# Patient Record
Sex: Male | Born: 1990 | Race: White | Hispanic: No | Marital: Single | Smoking: Current every day smoker
Health system: Southern US, Community
[De-identification: ages and names within clinical notes are randomized; demographics above are authoritative.]

## PROBLEM LIST (undated history)

## (undated) DIAGNOSIS — F419 Anxiety disorder, unspecified: Secondary | ICD-10-CM

## (undated) DIAGNOSIS — F845 Asperger's syndrome: Secondary | ICD-10-CM

## (undated) DIAGNOSIS — G4700 Insomnia, unspecified: Secondary | ICD-10-CM

---
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
## 2012-10-06 ENCOUNTER — Emergency Department (HOSPITAL_COMMUNITY): Admission: EM | Admit: 2012-10-06 | Payer: Self-pay | Source: Home / Self Care

## 2013-02-19 ENCOUNTER — Emergency Department (HOSPITAL_COMMUNITY)
Admission: EM | Admit: 2013-02-19 | Discharge: 2013-02-19 | Discharge status: Against Medical Advice | Payer: No Typology Code available for payment source | Attending: Emergency Medicine | Admitting: Emergency Medicine

## 2013-02-19 DIAGNOSIS — Y9389 Activity, other specified: Secondary | ICD-10-CM | POA: Insufficient documentation

## 2013-02-19 DIAGNOSIS — Y92410 Unspecified street and highway as the place of occurrence of the external cause: Secondary | ICD-10-CM | POA: Insufficient documentation

## 2013-02-19 DIAGNOSIS — S4980XA Other specified injuries of shoulder and upper arm, unspecified arm, initial encounter: Secondary | ICD-10-CM | POA: Insufficient documentation

## 2013-02-19 DIAGNOSIS — S46909A Unspecified injury of unspecified muscle, fascia and tendon at shoulder and upper arm level, unspecified arm, initial encounter: Secondary | ICD-10-CM | POA: Insufficient documentation

## 2013-02-19 NOTE — ED Notes (Signed)
Pt to waiting area nurse first saying he wasn't going to wait any longer; ambulated without difficulty; walked out with mother
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
## 2013-02-19 NOTE — ED Notes (Signed)
Pt walking around in waiting area without difficulty; mother to triage desk multiple times asking how much longer; informed of wait

## 2013-03-28 ENCOUNTER — Encounter (HOSPITAL_COMMUNITY): Payer: Self-pay | Admitting: Emergency Medicine

## 2013-03-28 ENCOUNTER — Emergency Department (HOSPITAL_COMMUNITY)
Admission: EM | Admit: 2013-03-28 | Discharge: 2013-03-28 | Disposition: A | Discharge status: Home / Self Care | Payer: No Typology Code available for payment source | Attending: Emergency Medicine | Admitting: Emergency Medicine

## 2013-03-28 ENCOUNTER — Emergency Department (HOSPITAL_COMMUNITY): Payer: No Typology Code available for payment source

## 2013-03-28 DIAGNOSIS — G479 Sleep disorder, unspecified: Secondary | ICD-10-CM | POA: Insufficient documentation

## 2013-03-28 DIAGNOSIS — G43909 Migraine, unspecified, not intractable, without status migrainosus: Secondary | ICD-10-CM | POA: Insufficient documentation

## 2013-03-28 DIAGNOSIS — F17200 Nicotine dependence, unspecified, uncomplicated: Secondary | ICD-10-CM | POA: Insufficient documentation

## 2013-03-28 DIAGNOSIS — S060XAA Concussion with loss of consciousness status unknown, initial encounter: Secondary | ICD-10-CM

## 2013-03-28 DIAGNOSIS — S060X9A Concussion with loss of consciousness of unspecified duration, initial encounter: Secondary | ICD-10-CM

## 2013-03-28 DIAGNOSIS — H5500 Unspecified nystagmus: Secondary | ICD-10-CM | POA: Insufficient documentation

## 2013-03-28 DIAGNOSIS — W1809XA Striking against other object with subsequent fall, initial encounter: Secondary | ICD-10-CM | POA: Insufficient documentation

## 2013-03-28 DIAGNOSIS — Y92410 Unspecified street and highway as the place of occurrence of the external cause: Secondary | ICD-10-CM | POA: Insufficient documentation

## 2013-03-28 DIAGNOSIS — F411 Generalized anxiety disorder: Secondary | ICD-10-CM | POA: Insufficient documentation

## 2013-03-28 DIAGNOSIS — S060X0A Concussion without loss of consciousness, initial encounter: Secondary | ICD-10-CM | POA: Insufficient documentation

## 2013-03-28 DIAGNOSIS — Y9351 Activity, roller skating (inline) and skateboarding: Secondary | ICD-10-CM | POA: Insufficient documentation

## 2013-03-28 MED ORDER — IBUPROFEN 800 MG PO TABS: 800.0000 mg | ORAL_TABLET | Freq: Three times a day (TID) | ORAL | Status: DC
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
## 2013-03-28 NOTE — Discharge Instructions (Signed)
Call for a follow up appointment with a Family or Primary Care Provider.  Call for an appointment with Dr. Amada JupiterKirkpatrick. Return if Symptoms worsen.   Take medication as prescribed.  Continue cognitive rest (sitting in a dark quite room).  Limit screen time. You can take Ibuprofen for headaches.
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
## 2013-03-28 NOTE — ED Notes (Signed)
Pt returned from CT °

## 2013-04-03 NOTE — ED Provider Notes (Signed)
Medical screening examination/treatment/procedure(s) were performed by non-physician practitioner and as supervising physician I was immediately available for consultation/collaboration.   EKG Interpretation None        Estefano Victory J. Kion Huntsberry, MD 04/03/13 2117 

## 2013-05-09 ENCOUNTER — Emergency Department (HOSPITAL_COMMUNITY)
Admission: EM | Admit: 2013-05-09 | Discharge: 2013-05-09 | Disposition: A | Discharge status: Home / Self Care | Payer: No Typology Code available for payment source | Attending: Emergency Medicine | Admitting: Emergency Medicine

## 2013-05-09 ENCOUNTER — Encounter (HOSPITAL_COMMUNITY): Payer: Self-pay | Admitting: Emergency Medicine

## 2013-05-09 DIAGNOSIS — T22019A Burn of unspecified degree of unspecified forearm, initial encounter: Secondary | ICD-10-CM | POA: Insufficient documentation

## 2013-05-09 DIAGNOSIS — Y929 Unspecified place or not applicable: Secondary | ICD-10-CM | POA: Insufficient documentation

## 2013-05-09 DIAGNOSIS — G478 Other sleep disorders: Secondary | ICD-10-CM | POA: Insufficient documentation

## 2013-05-09 DIAGNOSIS — Y939 Activity, unspecified: Secondary | ICD-10-CM | POA: Insufficient documentation

## 2013-05-09 DIAGNOSIS — T22012A Burn of unspecified degree of left forearm, initial encounter: Secondary | ICD-10-CM

## 2013-05-09 DIAGNOSIS — Z046 Encounter for general psychiatric examination, requested by authority: Secondary | ICD-10-CM | POA: Insufficient documentation

## 2013-05-09 DIAGNOSIS — F17200 Nicotine dependence, unspecified, uncomplicated: Secondary | ICD-10-CM | POA: Insufficient documentation

## 2013-05-09 DIAGNOSIS — X19XXXA Contact with other heat and hot substances, initial encounter: Secondary | ICD-10-CM | POA: Insufficient documentation

## 2013-05-09 LAB — COMPREHENSIVE METABOLIC PANEL
ALBUMIN: 4.8 g/dL (ref 3.5–5.2)
ALT: 15 U/L (ref 0–53)
AST: 23 U/L (ref 0–37)
Alkaline Phosphatase: 57 U/L (ref 39–117)
BUN: 8 mg/dL (ref 6–23)
CALCIUM: 10.3 mg/dL (ref 8.4–10.5)
CO2: 28 mEq/L (ref 19–32)
Chloride: 98 mEq/L (ref 96–112)
Creatinine, Ser: 1.15 mg/dL (ref 0.50–1.35)
GFR calc Af Amer: >90 mL/min (ref >90)
GFR calc non Af Amer: 88 mL/min — ABNORMAL LOW (ref >90)
Glucose, Bld: 69 mg/dL — ABNORMAL LOW (ref 70–99)
Potassium: 3.7 mEq/L (ref 3.7–5.3)
SODIUM: 140 meq/L (ref 137–147)
TOTAL PROTEIN: 7.8 g/dL (ref 6.0–8.3)
Total Bilirubin: 0.5 mg/dL (ref 0.3–1.2)

## 2013-05-09 LAB — ACETAMINOPHEN LEVEL: Acetaminophen (Tylenol), Serum: <15 ug/mL (ref 10–30)

## 2013-05-09 LAB — RAPID URINE DRUG SCREEN, HOSP PERFORMED
Amphetamines: NOT DETECTED
Barbiturates: NOT DETECTED
Benzodiazepines: NOT DETECTED
COCAINE: NOT DETECTED
OPIATES: NOT DETECTED
TETRAHYDROCANNABINOL: NOT DETECTED

## 2013-05-09 LAB — CBC
HCT: 43.4 % (ref 39.0–52.0)
Hemoglobin: 15.2 g/dL (ref 13.0–17.0)
MCH: 31.3 pg (ref 26.0–34.0)
MCHC: 35 g/dL (ref 30.0–36.0)
MCV: 89.3 fL (ref 78.0–100.0)
PLATELETS: 244 10*3/uL (ref 150–400)
RBC: 4.86 MIL/uL (ref 4.22–5.81)
RDW: 12.4 % (ref 11.5–15.5)
WBC: 6.9 10*3/uL (ref 4.0–10.5)

## 2013-05-09 LAB — SALICYLATE LEVEL: Salicylate Lvl: <2 mg/dL — ABNORMAL LOW (ref 2.8–20.0)

## 2013-05-09 LAB — ETHANOL: Alcohol, Ethyl (B): <11 mg/dL (ref 0–11)

## 2013-05-09 NOTE — ED Provider Notes (Signed)
Medical screening examination/treatment/procedure(s) were conducted as a shared visit with non-physician practitioner(s) and myself.  I personally evaluated the patient during the encounter.   EKG Interpretation None      Patient here s/p IVC by Mother. He has issues with sleepwalking. Mother took out IVC for his unisom use and states he has reported he wants to die. Patient denies SI/HI. Reports his mother has Munchausen's by Proxy.  Antony MaduraKelly Humes did extensive research on patient's history - no prior SI attempts, no prior psychiatric admissions. Antony MaduraKelly Humes does not feel like patient has criteria for admission. After speaking with patient, I agree. He is relaxing comfortably, denies SI/HI. IVC rescinded, stable for discharge.  Dagmar HaitWilliam Birdia Jaycox, MD 05/09/13 [phone]
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
## 2013-05-09 NOTE — Discharge Instructions (Signed)

## 2013-06-14 ENCOUNTER — Encounter (HOSPITAL_COMMUNITY): Payer: Self-pay | Admitting: Emergency Medicine

## 2013-06-14 ENCOUNTER — Emergency Department (HOSPITAL_COMMUNITY)
Admission: EM | Admit: 2013-06-14 | Discharge: 2013-06-15 | Disposition: A | Discharge status: Home / Self Care | Payer: Self-pay | Attending: Emergency Medicine | Admitting: Emergency Medicine

## 2013-06-14 DIAGNOSIS — F17200 Nicotine dependence, unspecified, uncomplicated: Secondary | ICD-10-CM | POA: Insufficient documentation

## 2013-06-14 DIAGNOSIS — F4325 Adjustment disorder with mixed disturbance of emotions and conduct: Secondary | ICD-10-CM | POA: Insufficient documentation

## 2013-06-14 DIAGNOSIS — F4329 Adjustment disorder with other symptoms: Secondary | ICD-10-CM

## 2013-06-14 LAB — COMPREHENSIVE METABOLIC PANEL
ALT: 18 U/L (ref 0–53)
AST: 29 U/L (ref 0–37)
Albumin: 4.8 g/dL (ref 3.5–5.2)
Alkaline Phosphatase: 66 U/L (ref 39–117)
BUN: 5 mg/dL — ABNORMAL LOW (ref 6–23)
CO2: 30 meq/L (ref 19–32)
CREATININE: 0.95 mg/dL (ref 0.50–1.35)
Calcium: 9.8 mg/dL (ref 8.4–10.5)
Chloride: 100 mEq/L (ref 96–112)
Glucose, Bld: 117 mg/dL — ABNORMAL HIGH (ref 70–99)
Potassium: 4.2 mEq/L (ref 3.7–5.3)
Sodium: 143 mEq/L (ref 137–147)
Total Bilirubin: 0.3 mg/dL (ref 0.3–1.2)
Total Protein: 8 g/dL (ref 6.0–8.3)

## 2013-06-14 LAB — ETHANOL

## 2013-06-14 LAB — CBC
HCT: 45.5 % (ref 39.0–52.0)
Hemoglobin: 16.2 g/dL (ref 13.0–17.0)
MCH: 31.2 pg (ref 26.0–34.0)
MCHC: 35.6 g/dL (ref 30.0–36.0)
MCV: 87.7 fL (ref 78.0–100.0)
Platelets: 312 10*3/uL (ref 150–400)
RBC: 5.19 MIL/uL (ref 4.22–5.81)
RDW: 12.3 % (ref 11.5–15.5)
WBC: 9.5 10*3/uL (ref 4.0–10.5)

## 2013-06-14 LAB — RAPID URINE DRUG SCREEN, HOSP PERFORMED
Amphetamines: NOT DETECTED
Barbiturates: NOT DETECTED
Benzodiazepines: NOT DETECTED
Cocaine: NOT DETECTED
Opiates: NOT DETECTED
Tetrahydrocannabinol: NOT DETECTED

## 2013-06-14 LAB — SALICYLATE LEVEL: Salicylate Lvl: <2 mg/dL — ABNORMAL LOW (ref 2.8–20.0)

## 2013-06-14 LAB — ACETAMINOPHEN LEVEL: Acetaminophen (Tylenol), Serum: <15 ug/mL (ref 10–30)

## 2013-06-14 MED ORDER — IBUPROFEN 200 MG PO TABS: 600.0000 mg | ORAL_TABLET | Freq: Three times a day (TID) | ORAL | Status: DC | PRN

## 2013-06-14 MED ORDER — ALUM & MAG HYDROXIDE-SIMETH 200-200-20 MG/5ML PO SUSP: 30.0000 mL | ORAL | Status: DC | PRN

## 2013-06-14 MED ORDER — ACETAMINOPHEN 325 MG PO TABS
650.0000 mg | ORAL_TABLET | ORAL | Status: DC | PRN
Start: 2013-06-14 — End: 2013-06-15

## 2013-06-14 MED ORDER — LORAZEPAM 1 MG PO TABS: 1.0000 mg | ORAL_TABLET | Freq: Three times a day (TID) | ORAL | Status: DC | PRN

## 2013-06-14 MED ORDER — NICOTINE 21 MG/24HR TD PT24
21.0000 mg | MEDICATED_PATCH | Freq: Every day | TRANSDERMAL | Status: DC
  Administered 2013-06-14: 21 mg via TRANSDERMAL
  Filled 2013-06-14 (×2): qty 1

## 2013-06-14 MED ORDER — ONDANSETRON HCL 4 MG PO TABS: 4.0000 mg | ORAL_TABLET | Freq: Three times a day (TID) | ORAL | Status: DC | PRN

## 2013-06-14 MED ORDER — ZOLPIDEM TARTRATE 5 MG PO TABS: 5.0000 mg | ORAL_TABLET | Freq: Every evening | ORAL | Status: DC | PRN
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
## 2013-06-14 NOTE — ED Notes (Signed)
Pt has 2 20 dollar bills, 2 five dollar bills and 9 one dollar bills, a visa card, keys, shirt, pants. [employer] officers state patients flip flops were left at his apartment. Pt belongings locked in locker 26.
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
## 2013-06-14 NOTE — ED Notes (Signed)
Pt denies SI or HI at present, will continue to monitor for safety.

## 2013-06-14 NOTE — BHH Counselor (Signed)
Pt does not want his mother to visit Bobby Sullivan(Rhonda Rosser [phone]) and he declined to sign a consent to release info.  Evette Cristalaroline Paige Rileyann Florance, [place] Assessment Counselor
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
## 2013-06-14 NOTE — ED Notes (Signed)
Pt ambulatory to room from triage.  

## 2013-06-15 ENCOUNTER — Encounter (HOSPITAL_COMMUNITY): Payer: Self-pay | Admitting: Psychiatry

## 2013-06-15 DIAGNOSIS — F4329 Adjustment disorder with other symptoms: Secondary | ICD-10-CM | POA: Diagnosis present

## 2013-06-15 DIAGNOSIS — F4324 Adjustment disorder with disturbance of conduct: Secondary | ICD-10-CM
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
## 2013-06-15 NOTE — Consult Note (Signed)
Patient seen, evaluated by me. Treatment plan formulated by me. As per his previous information, patient's mother has committed him in the past. On evaluation the patient does not appear to be depressed, psychotic, denies any suicidal or homicidal ideation. Social worker to contact patient's father prior to his discharge for collateral information

## 2013-06-15 NOTE — [facility] Suicide Risk Assessment (Addendum)
Suicide Risk Assessment  Discharge Assessment     Demographic Factors:  Male, Adolescent or young adult, Caucasian and Unemployed  Total Time spent with patient: 20 minutes  Psychiatric Specialty Exam:     Blood pressure 120/71, pulse 89, temperature 98.6 F (37 C), temperature source Oral, resp. rate 16, SpO2 98.00%.There is no weight on file to calculate BMI.  General Appearance: Casual  Eye Contact::  Good  Speech:  Normal Rate  Volume:  Normal  Mood:  Euthymic  Affect:  Congruent  Thought Process:  Coherent  Orientation:  Full (Time, Place, and Person)  Thought Content:  WDL  Suicidal Thoughts:  No  Homicidal Thoughts:  No  Memory:  Immediate;   Good Recent;   Good Remote;   Good  Judgement:  Fair  Insight:  Good  Psychomotor Activity:  Normal  Concentration:  Good  Recall:  Good  Fund of Knowledge:Good  Language: Good  Akathisia:  No  Handed:  Right  AIMS (if indicated):     Assets:  Desire for Improvement Housing Leisure Time Physical Health Resilience Social Support Vocational/Educational  Sleep:       Musculoskeletal: Strength & Muscle Tone: within normal limits Gait & Station: normal Patient leans: N/A   Mental Status Per Nursing Assessment::   On Admission:   Patient was IVC's by his mother who has persistent mental illness  Current Mental Status by Physician: NA  Loss Factors: NA  Historical Factors: NA  Risk Reduction Factors:   Sense of responsibility to family, Positive social support and Positive coping skills or problem solving skills  Continued Clinical Symptoms:  Adjustment disorder with disturbance in conduct  Cognitive Features That Contribute To Risk:  Denies  Suicide Risk:  Minimal: No identifiable suicidal ideation.  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 No pertinent past medical history. AXIS IV:  educational problems, other psychosocial or environmental problems, problems related to social environment and problems with primary support group AXIS V:  61-70 mild symptoms  Plan Of Care/Follow-up recommendations:  Activity:  as tolerated Diet:  low-sodium heart healthy diet  Is patient on multiple antipsychotic therapies at discharge:  No   Has Patient had three or more failed trials of antipsychotic monotherapy by history:  No  Recommended Plan for Multiple Antipsychotic Therapies: NA    Nanine Means, PMH-NP 06/15/2013, 3:41 PM
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
## 2013-06-15 NOTE — Progress Notes (Signed)
[facility] did not get to see patient but will be sending information about [facility] Orange Card program, using the address provided.  °

## 2013-06-18 NOTE — ED Provider Notes (Signed)
Medical screening examination/treatment/procedure(s) were performed by non-physician practitioner and as supervising physician I was immediately available for consultation/collaboration.    Pradeep Beaubrun, MD 06/18/13 0700 

## 2013-12-17 ENCOUNTER — Emergency Department (HOSPITAL_COMMUNITY)
Admission: EM | Admit: 2013-12-17 | Discharge: 2013-12-18 | Disposition: A | Discharge status: Home / Self Care | Payer: Self-pay | Attending: Emergency Medicine | Admitting: Emergency Medicine

## 2013-12-17 ENCOUNTER — Emergency Department (HOSPITAL_COMMUNITY): Payer: No Typology Code available for payment source

## 2013-12-17 ENCOUNTER — Encounter (HOSPITAL_COMMUNITY): Payer: Self-pay | Admitting: Emergency Medicine

## 2013-12-17 DIAGNOSIS — Z8659 Personal history of other mental and behavioral disorders: Secondary | ICD-10-CM | POA: Insufficient documentation

## 2013-12-17 DIAGNOSIS — Z8669 Personal history of other diseases of the nervous system and sense organs: Secondary | ICD-10-CM | POA: Insufficient documentation

## 2013-12-17 DIAGNOSIS — Y9389 Activity, other specified: Secondary | ICD-10-CM | POA: Insufficient documentation

## 2013-12-17 DIAGNOSIS — R4182 Altered mental status, unspecified: Secondary | ICD-10-CM | POA: Insufficient documentation

## 2013-12-17 DIAGNOSIS — Z720 Tobacco use: Secondary | ICD-10-CM | POA: Insufficient documentation

## 2013-12-17 DIAGNOSIS — R000 Tachycardia, unspecified: Secondary | ICD-10-CM | POA: Insufficient documentation

## 2013-12-17 DIAGNOSIS — Y92410 Unspecified street and highway as the place of occurrence of the external cause: Secondary | ICD-10-CM | POA: Insufficient documentation

## 2013-12-17 DIAGNOSIS — Y998 Other external cause status: Secondary | ICD-10-CM | POA: Insufficient documentation

## 2013-12-17 HISTORY — DX: Asperger's syndrome: F84.5

## 2013-12-17 HISTORY — DX: Insomnia, unspecified: G47.00

## 2013-12-17 LAB — CBC WITH DIFFERENTIAL/PLATELET
Basophils Absolute: 0.1 10*3/uL (ref 0.0–0.1)
Basophils Relative: 1 % (ref 0–1)
EOS ABS: 0.5 10*3/uL (ref 0.0–0.7)
Eosinophils Relative: 4 % (ref 0–5)
HCT: 42.1 % (ref 39.0–52.0)
HEMOGLOBIN: 14.9 g/dL (ref 13.0–17.0)
LYMPHS PCT: 22 % (ref 12–46)
Lymphs Abs: 2.6 10*3/uL (ref 0.7–4.0)
MCH: 31.2 pg (ref 26.0–34.0)
MCHC: 35.4 g/dL (ref 30.0–36.0)
MCV: 88.3 fL (ref 78.0–100.0)
MONOS PCT: 11 % (ref 3–12)
Monocytes Absolute: 1.3 10*3/uL — ABNORMAL HIGH (ref 0.1–1.0)
NEUTROS PCT: 62 % (ref 43–77)
Neutro Abs: 7.1 10*3/uL (ref 1.7–7.7)
PLATELETS: 249 10*3/uL (ref 150–400)
RBC: 4.77 MIL/uL (ref 4.22–5.81)
RDW: 12.4 % (ref 11.5–15.5)
WBC: 11.5 10*3/uL — ABNORMAL HIGH (ref 4.0–10.5)

## 2013-12-17 LAB — ETHANOL: Alcohol, Ethyl (B): <11 mg/dL (ref 0–11)

## 2013-12-17 LAB — BASIC METABOLIC PANEL
Anion gap: 14 (ref 5–15)
BUN: 15 mg/dL (ref 6–23)
CO2: 26 meq/L (ref 19–32)
Calcium: 9.4 mg/dL (ref 8.4–10.5)
Chloride: 99 mEq/L (ref 96–112)
Creatinine, Ser: 1.05 mg/dL (ref 0.50–1.35)
GFR calc Af Amer: >90 mL/min (ref >90)
GFR calc non Af Amer: >90 mL/min (ref >90)
GLUCOSE: 87 mg/dL (ref 70–99)
POTASSIUM: 3.4 meq/L — AB (ref 3.7–5.3)
SODIUM: 139 meq/L (ref 137–147)

## 2013-12-17 LAB — RAPID URINE DRUG SCREEN, HOSP PERFORMED
AMPHETAMINES: NOT DETECTED
BARBITURATES: NOT DETECTED
BENZODIAZEPINES: NOT DETECTED
Cocaine: NOT DETECTED
OPIATES: NOT DETECTED
TETRAHYDROCANNABINOL: NOT DETECTED

## 2013-12-17 MED ORDER — SODIUM CHLORIDE 0.9 % IV BOLUS (SEPSIS)
1000.0000 mL | Freq: Once | INTRAVENOUS | Status: AC
  Administered 2013-12-17: 1000 mL via INTRAVENOUS

## 2013-12-17 MED ORDER — FOLIC ACID 5 MG/ML IJ SOLN
Freq: Once | INTRAMUSCULAR | Status: AC
Start: 2013-12-17 — End: 2013-12-18
  Administered 2013-12-17: 21:00:00 via INTRAVENOUS
  Filled 2013-12-17: qty 1000

## 2013-12-17 MED ORDER — LORAZEPAM 2 MG/ML IJ SOLN
1.0000 mg | Freq: Once | INTRAMUSCULAR | Status: AC
  Administered 2013-12-17: 1 mg via INTRAVENOUS
  Filled 2013-12-17: qty 1

## 2013-12-17 MED ORDER — SODIUM CHLORIDE 0.9 % IV BOLUS (SEPSIS)
1000.0000 mL | Freq: Once | INTRAVENOUS | Status: AC
Start: 2013-12-17 — End: 2013-12-18
  Administered 2013-12-17: 1000 mL via INTRAVENOUS

## 2013-12-17 NOTE — ED Notes (Signed)
Pt appears more relaxed than prior assessments.  Pt is not as fidgety, and is sitting still in the bed watching the movement in the hall.

## 2013-12-17 NOTE — ED Notes (Signed)
[employer] arrived to deliver IVC papers for pt, taken out by mother.
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
## 2013-12-17 NOTE — ED Notes (Addendum)
Psych assessment limited due to pt inability to answer questions.  Pt denies any alteration in mental state, or ingestion of substance, legal or illegal.  Pt has hx of aspergers, but mother sts his current behaviors are abnormal, and has been noted in past when pt has ingested medications in large quantities or huffed aerosol duster.    Pts eyes are jetting back and forth, and his mood is changing from staring off/smiling, to noting hallucinations (pt denies), and panic.  Pt attempts to climb out of bed, pull on IV and other medical devices in room.    Safety sitter at bedside at this time.
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
## 2013-12-18 ENCOUNTER — Encounter (HOSPITAL_COMMUNITY): Payer: Self-pay | Admitting: *Deleted

## 2013-12-18 DIAGNOSIS — F845 Asperger's syndrome: Secondary | ICD-10-CM

## 2013-12-18 DIAGNOSIS — F1919 Other psychoactive substance abuse with unspecified psychoactive substance-induced disorder: Secondary | ICD-10-CM
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
## 2013-12-18 NOTE — ED Notes (Signed)
PT'S MOTHER CALLED AND ADVISED PT HAD LIED TO HER ABOUT HIM HAVING A JOB. STATES HE INHALED COMPUTER AEROSOL SPRAY AND TOOK LARGE AMOUNT OF SLEEPING PILLS, HAD PACKED HIS BELONGINGS AND HAD LIST OF HOMELESS SHELTERS. GAVE MOTHER NUMBER TO [facility] COUNSELORS AS SHE REQUESTED.

## 2013-12-18 NOTE — ED Notes (Signed)
PT NOW CALLING HIS MOTHER.

## 2013-12-18 NOTE — ED Notes (Signed)
DR Darci CurrentJONLANGADDA TO ENTER NOTE SO IVC PAPERS MAY BE RESCINDED AND PT D/C TO HOME.
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
## 2013-12-18 NOTE — Discharge Instructions (Signed)
Avoid using inappropriate medication.  Follow up with a primary care doctor AS NEEDED FOR PROBLEMS.

## 2013-12-18 NOTE — ED Notes (Signed)
Telepsych in process 

## 2013-12-18 NOTE — ED Notes (Signed)
Explained to patient that he will be staying here overnight. Pts mother leaving and has patient's belongings.
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
## 2013-12-18 NOTE — ED Notes (Signed)
Pt resting quietly at the time. Sitter at bedside. No signs of distress noted.

## 2013-12-18 NOTE — ED Notes (Signed)
Dr. Jonalagada at bedside.  

## 2013-12-18 NOTE — ED Notes (Signed)
DORIS, SW, ADVISED IS CONTACTING PT'S MOTHER.

## 2013-12-28 ENCOUNTER — Encounter (HOSPITAL_COMMUNITY): Payer: Self-pay | Admitting: *Deleted

## 2013-12-28 ENCOUNTER — Emergency Department (HOSPITAL_COMMUNITY)
Admission: EM | Admit: 2013-12-28 | Discharge: 2013-12-30 | Disposition: A | Discharge status: Home / Self Care | Payer: Self-pay | Attending: Emergency Medicine | Admitting: Emergency Medicine

## 2013-12-28 ENCOUNTER — Other Ambulatory Visit: Payer: Self-pay

## 2013-12-28 DIAGNOSIS — Z720 Tobacco use: Secondary | ICD-10-CM | POA: Insufficient documentation

## 2013-12-28 DIAGNOSIS — F4324 Adjustment disorder with disturbance of conduct: Secondary | ICD-10-CM | POA: Insufficient documentation

## 2013-12-28 DIAGNOSIS — F1310 Sedative, hypnotic or anxiolytic abuse, uncomplicated: Secondary | ICD-10-CM | POA: Insufficient documentation

## 2013-12-28 DIAGNOSIS — R000 Tachycardia, unspecified: Secondary | ICD-10-CM | POA: Insufficient documentation

## 2013-12-28 DIAGNOSIS — Z8669 Personal history of other diseases of the nervous system and sense organs: Secondary | ICD-10-CM | POA: Insufficient documentation

## 2013-12-28 DIAGNOSIS — F4329 Adjustment disorder with other symptoms: Secondary | ICD-10-CM

## 2013-12-28 LAB — BASIC METABOLIC PANEL
Anion gap: 18 — ABNORMAL HIGH (ref 5–15)
BUN: 15 mg/dL (ref 6–23)
CALCIUM: 9.5 mg/dL (ref 8.4–10.5)
CO2: 24 mEq/L (ref 19–32)
Chloride: 102 mEq/L (ref 96–112)
Creatinine, Ser: 1.37 mg/dL — ABNORMAL HIGH (ref 0.50–1.35)
GFR calc non Af Amer: 71 mL/min — ABNORMAL LOW (ref >90)
GFR, EST AFRICAN AMERICAN: 82 mL/min — AB (ref >90)
GLUCOSE: 92 mg/dL (ref 70–99)
Potassium: 3.4 mEq/L — ABNORMAL LOW (ref 3.7–5.3)
SODIUM: 144 meq/L (ref 137–147)

## 2013-12-28 LAB — ETHANOL: Alcohol, Ethyl (B): <11 mg/dL (ref 0–11)

## 2013-12-28 LAB — CBC WITH DIFFERENTIAL/PLATELET
BASOS PCT: 1 % (ref 0–1)
Basophils Absolute: 0.1 10*3/uL (ref 0.0–0.1)
EOS ABS: 0.8 10*3/uL — AB (ref 0.0–0.7)
EOS PCT: 7 % — AB (ref 0–5)
HCT: 40.7 % (ref 39.0–52.0)
Hemoglobin: 14.2 g/dL (ref 13.0–17.0)
LYMPHS ABS: 4.2 10*3/uL — AB (ref 0.7–4.0)
Lymphocytes Relative: 35 % (ref 12–46)
MCH: 31 pg (ref 26.0–34.0)
MCHC: 34.9 g/dL (ref 30.0–36.0)
MCV: 88.9 fL (ref 78.0–100.0)
Monocytes Absolute: 1.4 10*3/uL — ABNORMAL HIGH (ref 0.1–1.0)
Monocytes Relative: 11 % (ref 3–12)
NEUTROS PCT: 46 % (ref 43–77)
Neutro Abs: 5.6 10*3/uL (ref 1.7–7.7)
Platelets: 287 10*3/uL (ref 150–400)
RBC: 4.58 MIL/uL (ref 4.22–5.81)
RDW: 12.6 % (ref 11.5–15.5)
WBC: 12.1 10*3/uL — ABNORMAL HIGH (ref 4.0–10.5)

## 2013-12-28 LAB — ACETAMINOPHEN LEVEL

## 2013-12-28 LAB — SALICYLATE LEVEL: Salicylate Lvl: <2 mg/dL — ABNORMAL LOW (ref 2.8–20.0)

## 2013-12-28 MED ORDER — SODIUM CHLORIDE 0.9 % IV BOLUS (SEPSIS)
1000.0000 mL | Freq: Once | INTRAVENOUS | Status: AC
Start: 2013-12-28 — End: 2013-12-28
  Administered 2013-12-28: 1000 mL via INTRAVENOUS

## 2013-12-28 NOTE — ED Notes (Signed)
Pt brought in by [employer] voluntarily  Pt has hx of using chloracedin, benadryl, and air duster and his mother is concerned that pt has suicidal and homicidal tendencies  Pt had a belt wrapped around his neck earlier today  Pt has been running around his neighborhood trying to catch the neighbors cat and banging on peoples windows and such  Pt states he has not done anything today

## 2013-12-28 NOTE — ED Provider Notes (Signed)
CSN: [id number]     Arrival date & time 12/28/13  2156 History   First MD Initiated Contact with Patient 12/28/13 2204     Chief Complaint  Patient presents with  . Medical Clearance     (Consider location/radiation/quality/duration/timing/severity/associated sxs/prior Treatment) HPI Comments: Patient was brought in by police after being called by patient's mother with concern that he had suicidal and homicidal tendencies. Patient has a long history of over-the-counter drug abuse including Benadryl and air duster. Patient has been knocking on neighbors doors, trying to catch the neighbors cat and banging peoples windows.  Mom also states he wrapped a belt around his neck today which patient admits to but states he was just putting it there so he can climb on top of the house and repel down because that would be fun. Patient denies taking any medications today however police state he admitted to taking some sleeping medicine. He currently denies any suicidal or homicidal ideation.  The history is provided by the patient and the police.    Past Medical History  Diagnosis Date  . Aspergers' syndrome   . Insomnia    History reviewed. No pertinent past surgical history. History reviewed. No pertinent family history. History  Substance Use Topics  . Smoking status: Current Every Day Smoker -- 0.10 packs/day    Types: Cigarettes  . Smokeless tobacco: Not on file  . Alcohol Use: Yes     Comment: occasional    Review of Systems  All other systems reviewed and are negative.     Allergies  Review of patient's allergies indicates no known allergies.  Home Medications   Prior to Admission medications   Medication Sig Start Date End Date Taking? Authorizing Provider  diphenhydrAMINE (SOMINEX) 25 MG tablet Take 25 mg by mouth at bedtime as needed for sleep. For other reasons unknown    Historical Provider, MD   BP 124/64 mmHg  Pulse 132  Temp(Src) 97.7 F (36.5 C) (Oral)  Resp 18   SpO2 100% Physical Exam  Constitutional: He is oriented to person, place, and time. He appears well-developed and well-nourished. No distress.  HENT:  Head: Normocephalic and atraumatic.  Mouth/Throat: Oropharynx is clear and moist. Mucous membranes are dry.  Eyes: Conjunctivae and EOM are normal. Pupils are equal, round, and reactive to light.  Neck: Normal range of motion. Neck supple.  Cardiovascular: Regular rhythm and intact distal pulses.  Tachycardia present.   No murmur heard. Pulmonary/Chest: Effort normal and breath sounds normal. No respiratory distress. He has no wheezes. He has no rales.  Abdominal: Soft. He exhibits no distension. There is no tenderness. There is no rebound and no guarding.  Musculoskeletal: Normal range of motion. He exhibits no edema or tenderness.  Neurological: He is alert and oriented to person, place, and time.  Skin: Skin is warm and dry. No rash noted. No erythema.  Psychiatric: His affect is blunt. He is actively hallucinating. He expresses no homicidal and no suicidal ideation.  Patient has an extremely flat affect but does communicate. He is looking around the room continuously and appears to be seeing things but denies hallucinations.  Nursing note and vitals reviewed.   ED Course  Procedures (including critical care time) Labs Review Labs Reviewed  CBC WITH DIFFERENTIAL - Abnormal; Notable for the following:    WBC 12.1 (*)    Lymphs Abs 4.2 (*)    Monocytes Absolute 1.4 (*)    Eosinophils Relative 7 (*)    Eosinophils Absolute 0.8 (*)  All other components within normal limits  BASIC METABOLIC PANEL - Abnormal; Notable for the following:    Potassium 3.4 (*)    Creatinine, Ser 1.37 (*)    GFR calc non Af Amer 71 (*)    GFR calc Af Amer 82 (*)    Anion gap 18 (*)    All other components within normal limits  SALICYLATE LEVEL - Abnormal; Notable for the following:    Salicylate Lvl <2.0 (*)    All other components within normal  limits  ETHANOL  ACETAMINOPHEN LEVEL  URINE RAPID DRUG SCREEN (HOSP PERFORMED)    Imaging Review No results found.   Date: 12/28/2013  Rate: 132  Rhythm: sinus tachycardia  QRS Axis: normal  Intervals: QT prolonged  ST/T Wave abnormalities: nonspecific T wave changes  Conduction Disutrbances:none  Narrative Interpretation:   Old EKG Reviewed: unchanged    MDM   Final diagnoses:  None    Patient presenting with police voluntarily after mother called concerned that he has homicidal and suicidal tendencies. He has a history of using chloracedin, Benadryl and air duster. earlier today mother reports that he wrapped a belt around his neck. Patient admits to wrapping the belt around his neck but states he was doing it to climb on top of the roof and repel down b/c it is fun.  He denies SI and HI.  Pt denies taking any meds to me but told police that he took some sleeping medication.  Pt denies hallucinations but appears to be looking at something while in the room.  Patient was seen for similar symptoms approximately 1 week ago. At that time he was also displaying anticholinergic activity but denied using any medication except for a sleeping medication.  Patient tachycardic here with sinus tachycardia by EKG. Medical screening labs pending. He has otherwise awake and alert with flat affect. Will have TTS evaluate.  12:03 AM HR improved to 115-119 after fluids.  Will have TTS evaluate.  Labs unremarkable except for mild increase in creatinine which is most likely related to dehydration.  Gwyneth SproutWhitney Kailey Esquilin, MD 12/29/13 [phone]

## 2013-12-29 DIAGNOSIS — F329 Major depressive disorder, single episode, unspecified: Secondary | ICD-10-CM

## 2013-12-29 DIAGNOSIS — F1910 Other psychoactive substance abuse, uncomplicated: Secondary | ICD-10-CM

## 2013-12-29 LAB — RAPID URINE DRUG SCREEN, HOSP PERFORMED
AMPHETAMINES: NOT DETECTED
Barbiturates: NOT DETECTED
Benzodiazepines: POSITIVE — AB
Cocaine: NOT DETECTED
Opiates: NOT DETECTED
TETRAHYDROCANNABINOL: NOT DETECTED

## 2013-12-29 MED ORDER — SODIUM CHLORIDE 0.9 % IV BOLUS (SEPSIS)
1000.0000 mL | Freq: Once | INTRAVENOUS | Status: AC
  Administered 2013-12-29: 1000 mL via INTRAVENOUS

## 2013-12-29 NOTE — ED Notes (Signed)
Patient is resting comfortably. Breathing WNL.  

## 2013-12-29 NOTE — BH Assessment (Signed)
Per Joann Glover, AC at [facility], adult unit is currently at capacity. Contacted the following facilities for placement:  BED AVAILABLE, FAXED CLINICAL INFORMATION: [facility], per Christy  AT CAPACITY: [facility], per Jennifer [facility], per Margaret [facility], per Em[facility], per Leah [facility], per [facility], per Janet [facility], per Victoria [facility], per [facility], per Bernad[facility], per Shawna [facility], per Kevin [facility], per Sue [facility], per [facility], per Kathy [facility], per Gail [facility], per Corey  NO RESPONSE: [facility]   Demarie Uhlig Ellis Ireene Ballowe Jr, LPC, NCC Triage Specialist [phone] 
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
## 2013-12-29 NOTE — ED Notes (Signed)
Called security to inform that pt needs to be scanned. Security stated they would be down shortly
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
## 2013-12-29 NOTE — BH Assessment (Addendum)
Received assessment request.  Spoke with Normajean GlasgowKaitlyn Szekowski, PA in the ED:  She stated pt was medically cleared.  She stated that his thought process was muddled as demonstrated by his thinking he could repel from the roof of his house with a belt around his neck.(Dr. Anitra LauthPlunkett was [facility])  Spoke with pt's nurse, Grover CanavanKrystal: Asked her to put TA equipment in pt's room for assessment.  She said she would in about 15 mins.    Called and equipment was not ready.  Will call again shortly.   Beryle FlockMary Jordana Dugue, MS, CRC, [facility] Triage Specialist [facility]

## 2013-12-29 NOTE — ED Notes (Signed)
Mary, TTS representative, reports that patient needs to be re-evaluated in the morning in order to seek outside treatment since [facility] Health does not currently have treatment to meet his needs.
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
## 2013-12-29 NOTE — ED Notes (Signed)
Dr. Ross at bedside

## 2013-12-29 NOTE — BH Assessment (Signed)
Assessment completed.  Spoke to PepsiCoeil Mashburn, PA-C: She advised 1) IP placement [facility] so seek outside placement and 2) have him re-evaluated by psychiatry in the morning.  Spoke to Dole FoodBrandy Harris who is also working with the pt:   Advised of plan.  She advised that she would share the information with Grover CanavanKrystal, his nurse.  Spoke with Dr. Devoria AlbeIva Knapp [facility](Kaitlyn Szekowsk is gone)i: Advised of the plan.  Beryle FlockMary Ramata Strothman, MS, CRC, [facility] Triage Specialist [facility]

## 2013-12-29 NOTE — ED Notes (Signed)
Pt's one bag pt belongings moved to locker 30
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
## 2013-12-30 NOTE — ED Notes (Signed)
Up to the bathroom 
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
## 2013-12-30 NOTE — ED Notes (Addendum)
Written dc instructions reviewed w/ patient.  Patient encouraged to [facility] and return/seek treatment for any suicidal/homicidal thoughts or urges.  Pt verbalized understanding.  Pt ambulatory to dc window w/ mHt, belongings returned after leaving the unit.  Pt's mother called and is here to pick him up.

## 2013-12-30 NOTE — Discharge Instructions (Signed)
Follow up with [facility] 

## 2013-12-30 NOTE — ED Notes (Signed)
Dr Tenny Crawoss and shuvon NP into see

## 2013-12-30 NOTE — ED Notes (Signed)
Up to the desk to call for a ride home.

## 2013-12-30 NOTE — [facility] Suicide Risk Assessment (Signed)
   Demographic Factors:  Adolescent or young adult  Total Time spent with patient: 20 minutes  Psychiatric Specialty Exam: Physical Exam  Psychiatric: His speech is normal and behavior is normal. Thought content normal. His mood appears anxious. Cognition and memory are normal. He expresses impulsivity.    Review of Systems  Constitutional: Negative.   HENT: Negative.   Eyes: Negative.   Respiratory: Negative.   Cardiovascular: Negative.   Gastrointestinal: Negative.   Genitourinary: Negative.   Musculoskeletal: Negative.   Skin: Negative.   Neurological: Negative.   Endo/Heme/Allergies: Negative.   Psychiatric/Behavioral: The patient has insomnia.     Blood pressure 99/66, pulse 89, temperature 98.1 F (36.7 C), temperature source Oral, resp. rate 16, SpO2 98 %.There is no weight on file to calculate BMI.  General Appearance: Casual  Eye Contact::  Fair  Speech:  Normal Rate  Volume:  Normal  Mood:  Euthymic  Affect:  Flat  Thought Process:  Goal Directed  Orientation:  Full (Time, Place, and Person)  Thought Content:  Rumination  Suicidal Thoughts:  No  Homicidal Thoughts:  No  Memory:  Immediate;   Fair Recent;   Fair Remote;   Fair  Judgement:  Fair  Insight:  Fair  Psychomotor Activity:  Normal  Concentration:  Good  Recall:  Good  Fund of Knowledge:Fair  Language: Fair  Akathisia:  No  Handed:  Right  AIMS (if indicated):     Assets:  Communication Skills Desire for Improvement Physical Health Social Support  Sleep:       Musculoskeletal: Strength & Muscle Tone: within normal limits Gait & Station: normal Patient leans: N/A   Mental Status Per Nursing Assessment::   On Admission:     Current Mental Status by Physician: NA  Loss Factors: NA  Historical Factors: NA  Risk Reduction Factors:   Living with another person, especially a relative  Continued Clinical Symptoms:  none  Cognitive Features That Contribute To Risk:   none  Suicide Risk:  Minimal: No identifiable suicidal ideation.  Patients presenting with no risk factors but with morbid ruminations; may be classified as minimal risk based on the severity of the depressive symptoms  Discharge Diagnoses:   AXIS I:  Depressive Disorder NOS, polysubstance abuse AXIS II:  Asbergers syndrome AXIS III:   Past Medical History  Diagnosis Date  . Aspergers' syndrome   . Insomnia    AXIS IV:  other psychosocial or environmental problems AXIS V:  61-70 mild symptoms  Plan Of Care/Follow-up recommendations:  Activity:  as tolerated Diet:  regular Other:  [facility], resources will be given to patient discharge  Is patient on multiple antipsychotic therapies at discharge:  No   Has Patient had three or more failed trials of antipsychotic monotherapy by history:  No  Recommended Plan for Multiple Antipsychotic Therapies: NA    Kira Hartl 12/30/2013, 11:17 AM

## 2014-03-20 ENCOUNTER — Emergency Department (HOSPITAL_COMMUNITY)
Admission: EM | Admit: 2014-03-20 | Discharge: 2014-03-20 | Disposition: A | Discharge status: Home / Self Care | Payer: Self-pay | Attending: Emergency Medicine | Admitting: Emergency Medicine

## 2014-03-20 ENCOUNTER — Encounter (HOSPITAL_COMMUNITY): Payer: Self-pay | Admitting: Emergency Medicine

## 2014-03-20 DIAGNOSIS — Z720 Tobacco use: Secondary | ICD-10-CM | POA: Insufficient documentation

## 2014-03-20 DIAGNOSIS — F1510 Other stimulant abuse, uncomplicated: Secondary | ICD-10-CM | POA: Insufficient documentation

## 2014-03-20 DIAGNOSIS — T50901A Poisoning by unspecified drugs, medicaments and biological substances, accidental (unintentional), initial encounter: Secondary | ICD-10-CM

## 2014-03-20 DIAGNOSIS — Z79899 Other long term (current) drug therapy: Secondary | ICD-10-CM | POA: Insufficient documentation

## 2014-03-20 DIAGNOSIS — Y998 Other external cause status: Secondary | ICD-10-CM | POA: Insufficient documentation

## 2014-03-20 DIAGNOSIS — Y9289 Other specified places as the place of occurrence of the external cause: Secondary | ICD-10-CM | POA: Insufficient documentation

## 2014-03-20 DIAGNOSIS — G8929 Other chronic pain: Secondary | ICD-10-CM | POA: Insufficient documentation

## 2014-03-20 DIAGNOSIS — T43611A Poisoning by caffeine, accidental (unintentional), initial encounter: Secondary | ICD-10-CM | POA: Insufficient documentation

## 2014-03-20 DIAGNOSIS — Y9389 Activity, other specified: Secondary | ICD-10-CM | POA: Insufficient documentation

## 2014-03-20 DIAGNOSIS — T450X1A Poisoning by antiallergic and antiemetic drugs, accidental (unintentional), initial encounter: Secondary | ICD-10-CM | POA: Insufficient documentation

## 2014-03-20 DIAGNOSIS — R000 Tachycardia, unspecified: Secondary | ICD-10-CM | POA: Insufficient documentation

## 2014-03-20 LAB — COMPREHENSIVE METABOLIC PANEL
ALT: 15 U/L (ref 0–53)
AST: 22 U/L (ref 0–37)
Albumin: 4.5 g/dL (ref 3.5–5.2)
Alkaline Phosphatase: 35 U/L — ABNORMAL LOW (ref 39–117)
Anion gap: 9 (ref 5–15)
BUN: 11 mg/dL (ref 6–23)
CALCIUM: 9.3 mg/dL (ref 8.4–10.5)
CO2: 25 mmol/L (ref 19–32)
CREATININE: 1.39 mg/dL — AB (ref 0.50–1.35)
Chloride: 105 mmol/L (ref 96–112)
GFR calc non Af Amer: 69 mL/min — ABNORMAL LOW (ref >90)
GFR, EST AFRICAN AMERICAN: 80 mL/min — AB (ref >90)
GLUCOSE: 114 mg/dL — AB (ref 70–99)
Potassium: 3.4 mmol/L — ABNORMAL LOW (ref 3.5–5.1)
SODIUM: 139 mmol/L (ref 135–145)
TOTAL PROTEIN: 7.2 g/dL (ref 6.0–8.3)
Total Bilirubin: 0.5 mg/dL (ref 0.3–1.2)

## 2014-03-20 LAB — CBC WITH DIFFERENTIAL/PLATELET
BASOS ABS: 0.1 10*3/uL (ref 0.0–0.1)
Basophils Relative: 1 % (ref 0–1)
EOS PCT: 1 % (ref 0–5)
Eosinophils Absolute: 0.1 10*3/uL (ref 0.0–0.7)
HCT: 41.7 % (ref 39.0–52.0)
Hemoglobin: 14.6 g/dL (ref 13.0–17.0)
LYMPHS ABS: 1.5 10*3/uL (ref 0.7–4.0)
LYMPHS PCT: 15 % (ref 12–46)
MCH: 30.7 pg (ref 26.0–34.0)
MCHC: 35 g/dL (ref 30.0–36.0)
MCV: 87.6 fL (ref 78.0–100.0)
MONO ABS: 0.6 10*3/uL (ref 0.1–1.0)
Monocytes Relative: 6 % (ref 3–12)
NEUTROS ABS: 7.7 10*3/uL (ref 1.7–7.7)
Neutrophils Relative %: 77 % (ref 43–77)
Platelets: 287 10*3/uL (ref 150–400)
RBC: 4.76 MIL/uL (ref 4.22–5.81)
RDW: 11.9 % (ref 11.5–15.5)
WBC: 10.1 10*3/uL (ref 4.0–10.5)

## 2014-03-20 LAB — RAPID URINE DRUG SCREEN, HOSP PERFORMED
Amphetamines: POSITIVE — AB
BARBITURATES: NOT DETECTED
Benzodiazepines: NOT DETECTED
COCAINE: NOT DETECTED
Opiates: NOT DETECTED
Tetrahydrocannabinol: NOT DETECTED

## 2014-03-20 LAB — ACETAMINOPHEN LEVEL: Acetaminophen (Tylenol), Serum: <10 ug/mL — ABNORMAL LOW (ref 10–30)

## 2014-03-20 LAB — SALICYLATE LEVEL: Salicylate Lvl: <4 mg/dL (ref 2.8–20.0)

## 2014-03-20 LAB — ETHANOL

## 2014-03-20 MED ORDER — LORAZEPAM 2 MG/ML IJ SOLN
1.0000 mg | Freq: Once | INTRAMUSCULAR | Status: AC
  Administered 2014-03-20: 1 mg via INTRAVENOUS
  Filled 2014-03-20: qty 1

## 2014-03-20 MED ORDER — SODIUM CHLORIDE 0.9 % IV BOLUS (SEPSIS)
1000.0000 mL | Freq: Once | INTRAVENOUS | Status: AC
  Administered 2014-03-20: 1000 mL via INTRAVENOUS

## 2014-03-20 NOTE — ED Notes (Signed)
Bed: ZO10WA15 Expected date:  Expected time:  Means of arrival:  Comments: Overdose

## 2014-03-20 NOTE — Progress Notes (Signed)
  CARE MANAGEMENT ED NOTE 03/20/2014  Patient:  Chalmers CaterHOGAN,Vishruth   Account Number:  [id number]  Date Initiated:  03/20/2014  Documentation initiated by:  Radford PaxFERRERO,Lois Ostrom  Subjective/Objective Assessment:   Patient presents to Ed with overdose of 40 caffeine pills, and thentook two benadryl to help him sleep.     Subjective/Objective Assessment Detail:   Patient with pmhx of Asperger's syndrome, insomnia     Action/Plan:   Action/Plan Detail:   Anticipated DC Date:       Status Recommendation to Physician:   Result of Recommendation:    Other ED Services  Consult Working Plan    DC Planning Services  Other  PCP issues    Choice offered to / List presented to:            Status of service:  Completed, signed off  ED Comments:   ED Comments Detail:  EDCM spoke to patient at bedside.  Patient answered to his name then appeared to be starring off in a distance.  [facility] asked [facility] if he had a primary care doctor?  Patient stated, "I had one when I was a kid, Dr. Onalee Huaavid but I don't have one now."  [facility] asked patient if he had insurance? Patient stated, "I have [employer], well, eventually I will when I pay everything off."  Discussed patient with EDRN. EDCM provide [facility], informed patient of services there and walk in times.  EDCM also provided patient with list of pcps who accept self pay patients, list of discount pharmacies and websites needymeds.org and GoodRX.com for medication assistance, phone number to inquire about the orange card, phone number to inquire about Mediciad, phone number to inquire about the Affordable Care Act, financial resources in the community such as local churches, salvation army, urban ministries, and dental assistance for uninsured patients. EDCM placed resources in patient's belongings bag.  Tech in patient's room assisting patient with urinal, patient unraveling gauze around IV site, corrected by ED tech.  No further EDCM needs at this  time.
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
## 2014-03-20 NOTE — ED Notes (Signed)
Pt is restless in room, taking himself off monitor and messing w/ IV, IV wrapped w/ kerlex

## 2014-03-20 NOTE — Discharge Instructions (Signed)
Accidental Overdose  A drug overdose occurs when a chemical substance (drug or medication) is used in amounts large enough to overcome a person. This may result in severe illness or death. This is a type of poisoning. Accidental overdoses of medications or other substances come from a variety of reasons. When this happens accidentally, it is often because the person taking the substance does not know enough about what they have taken. Drugs which commonly cause overdose deaths are alcohol, psychotropic medications (medications which affect the mind), pain medications, illegal drugs (street drugs) such as cocaine and heroin, and multiple drugs taken at the same time. It may result from careless behavior (such as over-indulging at a party). Other causes of overdose may include multiple drug use, a lapse in memory, or drug use after a period of no drug use.   Sometimes overdosing occurs because a person cannot remember if they have taken their medication.   A common unintentional overdose in young children involves multi-vitamins containing iron. Iron is a part of the hemoglobin molecule in blood. It is used to transport oxygen to living cells. When taken in small amounts, iron allows the body to restock hemoglobin. In large amounts, it causes problems in the body. If this overdose is not treated, it can lead to death.  Never take medicines that show signs of tampering or do not seem quite right. Never take medicines in the dark or in poor lighting. Read the label and check each dose of medicine before you take it. When adults are poisoned, it happens most often through carelessness or lack of information. Taking medicines in the dark or taking medicine prescribed for someone else to treat the same type of problem is a dangerous practice.  SYMPTOMS   Symptoms of overdose depend on the medication and amount taken. They can vary from over-activity with stimulant over-dosage, to sleepiness from depressants such as  alcohol, narcotics and tranquilizers. Confusion, dizziness, nausea and vomiting may be present. If problems are severe enough coma and death may result.  DIAGNOSIS   Diagnosis and management are generally straightforward if the drug is known. Otherwise it is more difficult. At times, certain symptoms and signs exhibited by the patient, or blood tests, can reveal the drug in question.   TREATMENT   In an emergency department, most patients can be treated with supportive measures. Antidotes may be available if there has been an overdose of opioids or benzodiazepines. A rapid improvement will often occur if this is the cause of overdose.  At home or away from medical care:   There may be no immediate problems or warning signs in children.   Not everything works well in all cases of poisoning.   Take immediate action. Poisons may act quickly.   If you think someone has swallowed medicine or a household product, and the person is unconscious, having seizures (convulsions), or is not breathing, immediately call for an ambulance.  IF a person is conscious and appears to be doing OK but has swallowed a poison:   Do not wait to see what effect the poison will have. Immediately call a poison control center (listed in the white pages of your telephone book under "Poison Control" or inside the front cover with other emergency numbers). Some poison control centers have TTY capability for the deaf. Check with your local center if you or someone in your family requires this service.   Keep the container so you can read the label on the product for ingredients.     Describe what, when, and how much was taken and the age and condition of the person poisoned. Inform them if the person is vomiting, choking, drowsy, shows a change in color or temperature of skin, is conscious or unconscious, or is convulsing.   Do not cause vomiting unless instructed by medical personnel. Do not induce vomiting or force liquids into a person who  is convulsing, unconscious, or very drowsy.  Stay calm and in control.    Activated charcoal also is sometimes used in certain types of poisoning and you may wish to add a supply to your emergency medicines. It is available without a prescription. Call a poison control center before using this medication.  PREVENTION   Thousands of children die every year from unintentional poisoning. This may be from household chemicals, poisoning from carbon monoxide in a car, taking their parent's medications, or simply taking a few iron pills or vitamins with iron. Poisoning comes from unexpected sources.   Store medicines out of the sight and reach of children, preferably in a locked cabinet. Do not keep medications in a food cabinet. Always store your medicines in a secure place. Get rid of expired medications.   If you have children living with you or have them as occasional guests, you should have child-resistant caps on your medicine containers. Keep everything out of reach. Child proof your home.   If you are called to the telephone or to answer the door while you are taking a medicine, take the container with you or put the medicine out of the reach of small children.   Do not take your medication in front of children. Do not tell your child how good a medication is and how good it is for them. They may get the idea it is more of a treat.   If you are an adult and have accidentally taken an overdose, you need to consider how this happened and what can be done to prevent it from happening again. If this was from a street drug or alcohol, determine if there is a problem that needs addressing. If you are not sure a problems exists, it is easy to talk to a professional and ask them if they think you have a problem. It is better to handle this problem in this way before it happens again and has a much worse consequence.  Document Released: 03/27/2004 Document Revised: 04/05/2011 Document Reviewed: 09/02/2008  ExitCare  Patient Information 2015 [employer]. This information is not intended to replace advice given to you by your health care provider. Make sure you discuss any questions you have with your health care provider.

## 2014-03-20 NOTE — ED Notes (Signed)
Pt 40 caffeine pills, 200 mg each, with 2 cups of coffee at 1500. Pt did so trying to stay awake, then decided he wanted to go to sleep. Took 2 benadryls at 1800.

## 2014-08-05 ENCOUNTER — Inpatient Hospital Stay (HOSPITAL_COMMUNITY)
Admission: EM | Admit: 2014-08-05 | Discharge: 2014-08-07 | DRG: 918 | Disposition: A | Discharge status: Home / Self Care | Payer: Self-pay | Attending: Internal Medicine | Admitting: Internal Medicine

## 2014-08-05 ENCOUNTER — Encounter (HOSPITAL_COMMUNITY): Payer: Self-pay | Admitting: Emergency Medicine

## 2014-08-05 DIAGNOSIS — F17210 Nicotine dependence, cigarettes, uncomplicated: Secondary | ICD-10-CM | POA: Diagnosis present

## 2014-08-05 DIAGNOSIS — T450X4A Poisoning by antiallergic and antiemetic drugs, undetermined, initial encounter: Principal | ICD-10-CM | POA: Diagnosis present

## 2014-08-05 DIAGNOSIS — F329 Major depressive disorder, single episode, unspecified: Secondary | ICD-10-CM | POA: Diagnosis present

## 2014-08-05 DIAGNOSIS — G4700 Insomnia, unspecified: Secondary | ICD-10-CM | POA: Diagnosis present

## 2014-08-05 DIAGNOSIS — F845 Asperger's syndrome: Secondary | ICD-10-CM | POA: Diagnosis present

## 2014-08-05 DIAGNOSIS — E876 Hypokalemia: Secondary | ICD-10-CM | POA: Diagnosis present

## 2014-08-05 LAB — COMPREHENSIVE METABOLIC PANEL
ALBUMIN: 5.1 g/dL — AB (ref 3.5–5.0)
ALK PHOS: 36 U/L — AB (ref 38–126)
ALT: 14 U/L — ABNORMAL LOW (ref 17–63)
ANION GAP: 10 (ref 5–15)
AST: 24 U/L (ref 15–41)
BUN: 16 mg/dL (ref 6–20)
CO2: 26 mmol/L (ref 22–32)
Calcium: 9.2 mg/dL (ref 8.9–10.3)
Chloride: 103 mmol/L (ref 101–111)
Creatinine, Ser: 1.15 mg/dL (ref 0.61–1.24)
GFR calc Af Amer: >60 mL/min (ref >60)
GLUCOSE: 138 mg/dL — AB (ref 65–99)
Potassium: 3.1 mmol/L — ABNORMAL LOW (ref 3.5–5.1)
Sodium: 139 mmol/L (ref 135–145)
Total Bilirubin: 0.4 mg/dL (ref 0.3–1.2)
Total Protein: 7.4 g/dL (ref 6.5–8.1)

## 2014-08-05 LAB — CBC
HCT: 40.2 % (ref 39.0–52.0)
Hemoglobin: 14.2 g/dL (ref 13.0–17.0)
MCH: 30.8 pg (ref 26.0–34.0)
MCHC: 35.3 g/dL (ref 30.0–36.0)
MCV: 87.2 fL (ref 78.0–100.0)
PLATELETS: 318 10*3/uL (ref 150–400)
RBC: 4.61 MIL/uL (ref 4.22–5.81)
RDW: 12.5 % (ref 11.5–15.5)
WBC: 12.8 10*3/uL — ABNORMAL HIGH (ref 4.0–10.5)

## 2014-08-05 LAB — SALICYLATE LEVEL: Salicylate Lvl: <4 mg/dL (ref 2.8–30.0)

## 2014-08-05 LAB — CBG MONITORING, ED: GLUCOSE-CAPILLARY: 132 mg/dL — AB (ref 65–99)

## 2014-08-05 LAB — ETHANOL: Alcohol, Ethyl (B): <5 mg/dL (ref <5)

## 2014-08-05 LAB — RAPID URINE DRUG SCREEN, HOSP PERFORMED
Amphetamines: NOT DETECTED
BENZODIAZEPINES: POSITIVE — AB
Barbiturates: NOT DETECTED
Cocaine: NOT DETECTED
Opiates: NOT DETECTED
Tetrahydrocannabinol: NOT DETECTED

## 2014-08-05 LAB — ACETAMINOPHEN LEVEL: Acetaminophen (Tylenol), Serum: <10 ug/mL — ABNORMAL LOW (ref 10–30)

## 2014-08-05 MED ORDER — SODIUM CHLORIDE 0.9 % IV SOLN
1000.0000 mL | Freq: Once | INTRAVENOUS | Status: AC
  Administered 2014-08-05: 1000 mL via INTRAVENOUS

## 2014-08-05 MED ORDER — MAGNESIUM SULFATE IN D5W 10-5 MG/ML-% IV SOLN
1.0000 g | Freq: Once | INTRAVENOUS | Status: AC
  Administered 2014-08-05: 1 g via INTRAVENOUS
  Filled 2014-08-05: qty 100

## 2014-08-05 MED ORDER — SODIUM CHLORIDE 0.9 % IV SOLN
1000.0000 mL | INTRAVENOUS | Status: DC
  Administered 2014-08-05: 1000 mL via INTRAVENOUS

## 2014-08-05 MED ORDER — LORAZEPAM 2 MG/ML IJ SOLN
1.0000 mg | Freq: Once | INTRAMUSCULAR | Status: AC
  Administered 2014-08-05: 1 mg via INTRAVENOUS
  Filled 2014-08-05: qty 1

## 2014-08-05 MED ORDER — POTASSIUM CHLORIDE CRYS ER 20 MEQ PO TBCR
40.0000 meq | EXTENDED_RELEASE_TABLET | Freq: Once | ORAL | Status: AC
  Administered 2014-08-06: 40 meq via ORAL
  Filled 2014-08-05: qty 2

## 2014-08-05 MED ORDER — POTASSIUM CHLORIDE 10 MEQ/100ML IV SOLN
10.0000 meq | Freq: Once | INTRAVENOUS | Status: AC
  Administered 2014-08-06: 10 meq via INTRAVENOUS
  Filled 2014-08-05: qty 100

## 2014-08-05 NOTE — ED Notes (Signed)
Poison control was contacted and their recommendation was due to QTc being high that Magnesium could be started now.  If QRS goes above 140 ms to IV push sodium bicarb at 1-2 mcq/kg.  Poison control advised against using geodon and haldol given the present EKG status.

## 2014-08-05 NOTE — ED Notes (Signed)
Bed: [id number] Expected date:  Expected time:  Means of arrival:  Comments: EMS/benadryl OD = 1600mg 

## 2014-08-05 NOTE — ED Notes (Signed)
Pt presents to ED via EMS following intentional Benadryl overdose at home this evening.  He ingested 32 50-mg capsules of Benadryl (~1600mg  total) in an effort to kill himself.  His mother reported to EMS that he had a falling-out with neighborhood friends who did not want his company on the Fourth of July, and since then he has been very depressed.  He has attempted suicide in the past.  He also has a hx of Aspberger's syndrome.  Pt arrived to the ED tachycardic (HR >150 en route) but other vital signs were stable and WNL.  Pt reports that he feels "nervous" and sleepy.  He appears agitated and occasionally seems to lose focus.  He is responding to verbal stimuli but his responses are only occasionally sensible - at one point he stated that he "definitely rubbed diarrhea in the cat's face tonight" when asked how he was feeling. He has white film coating the insides of his mouth and is breathing rapidly while staring at the ceiling.

## 2014-08-05 NOTE — ED Provider Notes (Signed)
CSN: [id number]     Arrival date & time 08/05/14  2033 History   First MD Initiated Contact with Patient 08/05/14 2054     Chief Complaint  Patient presents with  . Drug Overdose   HPI Patient presents to the emergency room after presumed Benadryl overdose. Patient has a history of Asperger's syndrome and insomnia. Patient's mother states that he often takes Benadryl to help him with his sleep. Over the last several days he has been having some trouble with depression. His mother states that the patient has asked him about going to hell if he committed suicide. Patient apparently has been upset about not being able to spend time with his friends during July 4 because they did not invite him.  His mother found a brand new bottle of 50 mg diphenhydramine pills that were completely empty.  This would be 1600 mg total.  Pt denies taking these medications.   Past Medical History  Diagnosis Date  . Aspergers' syndrome   . Insomnia    History reviewed. No pertinent past surgical history. History reviewed. No pertinent family history. History  Substance Use Topics  . Smoking status: Current Every Day Smoker -- 0.10 packs/day    Types: Cigarettes  . Smokeless tobacco: Not on file  . Alcohol Use: Yes     Comment: occasional    Review of Systems  All other systems reviewed and are negative.     Allergies  Review of patient's allergies indicates no known allergies.  Home Medications   Prior to Admission medications   Medication Sig Start Date End Date Taking? Authorizing Provider  diphenhydrAMINE (BENADRYL) 50 MG capsule Take 50 mg by mouth at bedtime as needed (for sleep).   Yes Historical Provider, MD   BP 142/90 mmHg  Pulse 124  Temp(Src) 98.5 F (36.9 C) (Oral)  Resp 28  SpO2 100% Physical Exam  Constitutional: He appears distressed.  HENT:  Head: Normocephalic and atraumatic.  Right Ear: External ear normal.  Left Ear: External ear normal.  Mouth/Throat: No oropharyngeal  exudate.  Mucous membranes are dry  Eyes: Conjunctivae are normal. Right eye exhibits no discharge. Left eye exhibits no discharge. No scleral icterus.  Pupils are dilated  Neck: Neck supple. No tracheal deviation present.  Cardiovascular: Regular rhythm and intact distal pulses.  Tachycardia present.   Pulmonary/Chest: Effort normal and breath sounds normal. No stridor. No respiratory distress. He has no wheezes. He has no rales.  Abdominal: Soft. Bowel sounds are normal. He exhibits no distension. There is no tenderness. There is no rebound and no guarding.  Musculoskeletal: He exhibits no edema or tenderness.  Neurological: He is alert. He displays tremor. No cranial nerve deficit (no facial droop, extraocular movements intact, no slurred speech) or sensory deficit. He exhibits normal muscle tone. He displays no seizure activity. GCS eye subscore is 4. GCS verbal subscore is 4.  Patient moves all extremities, slow to respond, voice is quiet  Skin: Skin is warm and dry. No rash noted. He is not diaphoretic.  Psychiatric: His speech is delayed. His speech is not rapid and/or pressured. He is not aggressive and not hyperactive.  Nursing note and vitals reviewed.   ED Course  Procedures (including critical care time)  CRITICAL CARE Performed by: WUJWJ,XBJKNAPP,Angles Trevizo Total critical care time: 35 Critical care time was exclusive of separately billable procedures and treating other patients. Critical care was necessary to treat or prevent imminent or life-threatening deterioration. Critical care was time spent personally by me  on the following activities: development of treatment plan with patient and/or surrogate as well as nursing, discussions with consultants, evaluation of patient's response to treatment, examination of patient, obtaining history from patient or surrogate, ordering and performing treatments and interventions, ordering and review of laboratory studies, ordering and review of radiographic  studies, pulse oximetry and re-evaluation of patient's condition. Labs Review Labs Reviewed  COMPREHENSIVE METABOLIC PANEL - Abnormal; Notable for the following:    Potassium 3.1 (*)    Glucose, Bld 138 (*)    Albumin 5.1 (*)    ALT 14 (*)    Alkaline Phosphatase 36 (*)    All other components within normal limits  ACETAMINOPHEN LEVEL - Abnormal; Notable for the following:    Acetaminophen (Tylenol), Serum <10 (*)    All other components within normal limits  CBC - Abnormal; Notable for the following:    WBC 12.8 (*)    All other components within normal limits  CBG MONITORING, ED - Abnormal; Notable for the following:    Glucose-Capillary 132 (*)    All other components within normal limits  ETHANOL  SALICYLATE LEVEL  URINE RAPID DRUG SCREEN, HOSP PERFORMED    Imaging Review No results found.   EKG Interpretation   Date/Time:  Monday August 05 2014 20:41:39 EDT Ventricular Rate:  134 PR Interval:  132 QRS Duration: 105 QT Interval:  431 QTC Calculation: 644 R Axis:   84 Text Interpretation:  Sinus tachycardia Consider right atrial enlargement  Probable left ventricular hypertrophy Prolonged QT interval No significant  change since last tracing Confirmed by Amiir Heckard  MD-J, Memory Heinrichs ([id number]) on  08/05/2014 9:02:19 PM      MDM   Final diagnoses:  Diphenhydramine overdose of undetermined intent, initial encounter    The patient's exam is consistent with a anticholinergic toxidrome. The patient denies ingesting more than a tablet or 2 however his mother found a completely empty pill bottle that appeared to be new. His symptoms certainly correlate with a more significant overdose. Poison center was contacted. The patient was started on empiric magnesium. We'll need to monitor his QRS interval. Plan on admission to the intensive care unit for monitoring all the patient is alert and protecting his airway appropriately.    Linwood Dibbles, MD 08/05/14 2236

## 2014-08-05 NOTE — ED Notes (Signed)
Poison Control following up - recommends replacing Potassium, repeat EKG, and monitor for urinary retention.  Notified Dr. Lynelle DoctorKnapp of recommendations.

## 2014-08-06 ENCOUNTER — Encounter (HOSPITAL_COMMUNITY): Payer: Self-pay | Admitting: Internal Medicine

## 2014-08-06 DIAGNOSIS — F845 Asperger's syndrome: Secondary | ICD-10-CM | POA: Diagnosis present

## 2014-08-06 DIAGNOSIS — T450X4A Poisoning by antiallergic and antiemetic drugs, undetermined, initial encounter: Principal | ICD-10-CM

## 2014-08-06 LAB — COMPREHENSIVE METABOLIC PANEL
ALBUMIN: 4.4 g/dL (ref 3.5–5.0)
ALK PHOS: 33 U/L — AB (ref 38–126)
ALT: 12 U/L — ABNORMAL LOW (ref 17–63)
AST: 19 U/L (ref 15–41)
Anion gap: 7 (ref 5–15)
BILIRUBIN TOTAL: 0.5 mg/dL (ref 0.3–1.2)
BUN: 12 mg/dL (ref 6–20)
CALCIUM: 8.3 mg/dL — AB (ref 8.9–10.3)
CO2: 25 mmol/L (ref 22–32)
Chloride: 105 mmol/L (ref 101–111)
Creatinine, Ser: 0.94 mg/dL (ref 0.61–1.24)
GFR calc non Af Amer: >60 mL/min (ref >60)
GLUCOSE: 95 mg/dL (ref 65–99)
POTASSIUM: 3.8 mmol/L (ref 3.5–5.1)
Sodium: 137 mmol/L (ref 135–145)
Total Protein: 6.4 g/dL — ABNORMAL LOW (ref 6.5–8.1)

## 2014-08-06 LAB — CBC
HCT: 38.5 % — ABNORMAL LOW (ref 39.0–52.0)
HEMOGLOBIN: 13.1 g/dL (ref 13.0–17.0)
MCH: 30.3 pg (ref 26.0–34.0)
MCHC: 34 g/dL (ref 30.0–36.0)
MCV: 88.9 fL (ref 78.0–100.0)
Platelets: 250 10*3/uL (ref 150–400)
RBC: 4.33 MIL/uL (ref 4.22–5.81)
RDW: 12.5 % (ref 11.5–15.5)
WBC: 12.5 10*3/uL — AB (ref 4.0–10.5)

## 2014-08-06 LAB — MRSA PCR SCREENING: MRSA BY PCR: NEGATIVE

## 2014-08-06 LAB — PHOSPHORUS: Phosphorus: 3.7 mg/dL (ref 2.5–4.6)

## 2014-08-06 LAB — MAGNESIUM: MAGNESIUM: 2.6 mg/dL — AB (ref 1.7–2.4)

## 2014-08-06 MED ORDER — ENOXAPARIN SODIUM 40 MG/0.4ML ~~LOC~~ SOLN
40.0000 mg | SUBCUTANEOUS | Status: DC
  Administered 2014-08-06: 40 mg via SUBCUTANEOUS
  Filled 2014-08-06 (×2): qty 0.4

## 2014-08-06 MED ORDER — ACETAMINOPHEN 325 MG PO TABS
650.0000 mg | ORAL_TABLET | Freq: Four times a day (QID) | ORAL | Status: DC | PRN
  Administered 2014-08-06: 650 mg via ORAL
  Filled 2014-08-06: qty 2

## 2014-08-06 MED ORDER — MAGNESIUM SULFATE IN D5W 10-5 MG/ML-% IV SOLN: 1.0000 g | INTRAVENOUS | Status: DC

## 2014-08-06 MED ORDER — ASPIRIN EC 81 MG PO TBEC
81.0000 mg | DELAYED_RELEASE_TABLET | Freq: Every day | ORAL | Status: DC
  Administered 2014-08-06: 81 mg via ORAL
  Filled 2014-08-06 (×2): qty 1

## 2014-08-06 MED ORDER — MAGNESIUM SULFATE 2 GM/50ML IV SOLN
2.0000 g | Freq: Once | INTRAVENOUS | Status: AC
  Administered 2014-08-06: 2 g via INTRAVENOUS
  Filled 2014-08-06: qty 50

## 2014-08-06 MED ORDER — POTASSIUM CHLORIDE IN NACL 20-0.45 MEQ/L-% IV SOLN
INTRAVENOUS | Status: DC
  Administered 2014-08-06 (×3): via INTRAVENOUS
  Filled 2014-08-06 (×6): qty 1000

## 2014-08-06 NOTE — Progress Notes (Signed)
Utilization review completed.  
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
## 2014-08-06 NOTE — Significant Event (Signed)
Patient has been safely transferred to [id number], taken via wheelchair. VS stable prior and during the transfer. Report given to receiving RN Irving BurtonEmily. Sitter remain at bedside. Patient settled in bed per his requests.

## 2014-08-06 NOTE — H&P (Signed)
[facility] History and Physical  Otilio Groleau WUJ:[id number] DOB: 26-May-1990 DOA: 08/05/2014  Referring physician:  PCP: No PCP Per Patient   Chief Complaint: Benadryl Overdose  HPI: Bobby Sullivan is a 24 y.o. male with a PMH of Asperger's syndrome who was brought to the ED after an apparent overdose of Diphenhydramine. The patient only stated that he took two tablets, but his mother stated earlier to the ER physician that he has been apparently depressed due to social issues with close friends and may have taken the whole content of pills of the bottle which amounts to a total of 1600 mg. He denies suicidal intentions and his mother is not available at the moment, but will be back later in the morning. The patient's chart shows a previous incident of OD with Benadryl and caffeine.  He denies any other symptoms and was in NAD    Review of Systems:  Constitutional:  No weight loss, night sweats, Fevers, chills, fatigue.  HEENT:  No headaches, Difficulty swallowing,Tooth/dental problems,Sore throat,  No sneezing, itching, ear ache, nasal congestion, post nasal drip,  Cardio-vascular:  No chest pain, Orthopnea, PND, swelling in lower extremities, anasarca, dizziness, palpitations  GI:  No heartburn, indigestion, abdominal pain, nausea, vomiting, diarrhea, change in bowel habits, loss of appetite  Resp:  No shortness of breath with exertion or at rest. No excess mucus, no productive cough, No non-productive cough, No coughing up of blood.No change in color of mucus.No wheezing.No chest wall deformity  Skin:  no rash or lesions.  GU:  no dysuria, change in color of urine, no urgency or frequency. No flank pain.  Musculoskeletal:  No joint pain or swelling. No decreased range of motion. No back pain.  Psych:  No change in mood or affect. No depression or anxiety. No memory loss.   Past Medical History  Diagnosis Date  . Aspergers' syndrome   . Insomnia    History  reviewed. No pertinent past surgical history. Social History:  reports that he has been smoking Cigarettes.  He has been smoking about 0.10 packs per day. He does not have any smokeless tobacco history on file. He reports that he drinks alcohol. He reports that he uses illicit drugs.  No Known Allergies  History reviewed.  Pertinent family History. Mother with Bipolar disorder I. Brother with Schizophrenia   Prior to Admission medications   Medication Sig Start Date End Date Taking? Authorizing Provider  diphenhydrAMINE (BENADRYL) 50 MG capsule Take 50 mg by mouth at bedtime as needed (for sleep).   Yes Historical Provider, MD   Physical Exam: Filed Vitals:   08/05/14 2050 08/05/14 2100 08/06/14 0042 08/06/14 0112  BP:  142/90 129/62 146/83  Pulse:  124 104 103  Temp:   98.3 F (36.8 C)   TempSrc:   Oral   Resp:  SpO2: 100% 100% 99% 99%    Wt Readings from Last 3 Encounters:  12/17/13 72.576 kg (160 lb)  05/09/13 72.576 kg (160 lb)  03/28/13 76.204 kg (168 lb)    General:  Appears calm and comfortable Eyes: PERRL, normal lids & conjunctiva, pupils were mildly dilated ENT: grossly normal hearing, lips & tongue are dry. Neck: no LAD, masses or thyromegaly Cardiovascular: S1S2, tachycardic, no m/r/g. No LE edema. Telemetry: SR, no arrhythmias  Respiratory: CTA bilaterally, no w/r/r. Normal respiratory effort. Abdomen: soft, ntnd Skin: no rash or induration seen on limited exam Musculoskeletal: grossly normal tone BUE/BLE Psychiatric: grossly normal mood and  affect, speech fluent and appropriate Neurologic: grossly non-focal.          Labs on Admission:  Basic Metabolic Panel:  Recent Labs Lab 08/05/14 2104  NA 139  K 3.1  CL 103  CO2 26  GLUCOSE 138  BUN 16  CREATININE 1.15  CALCIUM 9.2   Liver Function Tests:  Recent Labs Lab 08/05/14 2104  AST 24  ALT 14  ALKPHOS 36  BILITOT 0.4  PROT 7.4  ALBUMIN 5.1   No results for input(s):  LIPASE, AMYLASE in the last 168 hours. No results for input(s): AMMONIA in the last 168 hours. CBC:  Recent Labs Lab 08/05/14 2104  WBC 12.8  HGB 14.2  HCT 40.2  MCV 87.2  PLT 318   Cardiac Enzymes: No results for input(s): CKTOTAL, CKMB, CKMBINDEX, TROPONINI in the last 168 hours.  BNP (last 3 results) No results for input(s): BNP in the last 8760 hours.  ProBNP (last 3 results) No results for input(s): PROBNP in the last 8760 hours.  CBG:  Recent Labs Lab 08/05/14 2059  GLUCAP 132    Radiological Exams on Admission: No results found.  EKG: Independently reviewed.  Assessment/Plan Principal Problem:   Diphenhydramine overdose of undetermined intent Active Problems:   Asperger's syndrome   Plan: Admit to stepdown unit for close cardiac monitoring of potential Diphenhydramine induced arrhythmias. Continue IV hydration. Continue potassium replacement. Ordered more magnesium sulfate supplementation to prevent arrhythmogenic events.  Please consider Behavioral health consult during business hours.  DVT prophylaxis.    Code Status: Full DVT Prophylaxis: Lovenox daily. Family Communication:  Rosser,Rhonda Mother [phone]  [phone]   Disposition Plan: Home  Time spent: 60 minutes  Bobette MoDavid Manuel Tyheim [place] [facility] Pager [phone]

## 2014-08-06 NOTE — Progress Notes (Addendum)
Please note that pt was admitted after midnight. Please see earlier note by Dr. Robb Matarrtiz. Pt admitted to SDU for benadryl OD. In brief, patient is 24 year old male with known aspirin or syndrome who apparently overdosed on Benadryl. Patient explains that he has been having difficulty sleeping and was trying to take Benadryl and did not realize he took too much of it. Please note that mother was present at the bedside and was consequently arguing with patient. At one time patient asked we requests mother to leave her room. Mother insisting on psychiatrist to come for evaluation and recommendation on sleeping medication.  Psych consulted, pt stable for transfer to telemetry bed.   Debbora PrestoMAGICK-MYERS, ISKRA, MD  [facility] Pager [phone]  If 7PM-7AM, please contact night-coverage [email or web address] Password [email or web address]

## 2014-08-06 NOTE — Care Management Note (Signed)
Case Management Note  Patient Details  Name: Bobby Sullivan MRN: [id number] Date of Birth: July 20, 1990  Subjective/Objective: 24 y/o m admitted w/OD.                    Action/Plan: Monitor progress for d/c needs.   Expected Discharge Date:                  Expected Discharge Plan:  Home/Self Care  In-House Referral:     Discharge planning Services  CM Consult  Post Acute Care Choice:    Choice offered to:     DME Arranged:    DME Agency:     HH Arranged:    HH Agency:     Status of Service:  In process, will continue to follow  Medicare Important Message Given:    Date Medicare IM Given:    Medicare IM give by:    Date Additional Medicare IM Given:    Additional Medicare Important Message give by:     If discussed at Long Length of Stay Meetings, dates discussed:    Additional Comments:  Lanier ClamMahabir, Ruther Ephraim, RN 08/06/2014, 4:28 PM

## 2014-08-06 NOTE — Significant Event (Signed)
Patient wanted to be made a XXX patient. Patient discussed this with Dr. Izola PriceMyers during rounds. MD relayed information for staff to place patient as XXX. Charge RN called admission to have this in place. RN confirmed with patient and he expressed wishes that no one is to know he is in the hospital. Patient does not want information release to anyone, not to his mother or anyone else. Patient does not want to establish a passcode. Mardy Hoppe, Charity fundraiserN.

## 2014-08-07 DIAGNOSIS — F845 Asperger's syndrome: Secondary | ICD-10-CM

## 2014-08-07 DIAGNOSIS — T450X4D Poisoning by antiallergic and antiemetic drugs, undetermined, subsequent encounter: Secondary | ICD-10-CM

## 2014-08-07 DIAGNOSIS — G4700 Insomnia, unspecified: Secondary | ICD-10-CM

## 2014-08-07 LAB — BASIC METABOLIC PANEL
Anion gap: 9 (ref 5–15)
BUN: 12 mg/dL (ref 6–20)
CALCIUM: 8.9 mg/dL (ref 8.9–10.3)
CO2: 25 mmol/L (ref 22–32)
CREATININE: 0.92 mg/dL (ref 0.61–1.24)
Chloride: 104 mmol/L (ref 101–111)
GFR calc non Af Amer: >60 mL/min (ref >60)
Glucose, Bld: 80 mg/dL (ref 65–99)
POTASSIUM: 4.1 mmol/L (ref 3.5–5.1)
SODIUM: 138 mmol/L (ref 135–145)

## 2014-08-07 LAB — CBC
HCT: 41.7 % (ref 39.0–52.0)
Hemoglobin: 14.1 g/dL (ref 13.0–17.0)
MCH: 30.6 pg (ref 26.0–34.0)
MCHC: 33.8 g/dL (ref 30.0–36.0)
MCV: 90.5 fL (ref 78.0–100.0)
PLATELETS: 246 10*3/uL (ref 150–400)
RBC: 4.61 MIL/uL (ref 4.22–5.81)
RDW: 12.8 % (ref 11.5–15.5)
WBC: 7.2 10*3/uL (ref 4.0–10.5)

## 2014-08-07 LAB — MAGNESIUM: MAGNESIUM: 2.1 mg/dL (ref 1.7–2.4)

## 2014-08-07 MED ORDER — LORAZEPAM 2 MG PO TABS: 2.0000 mg | ORAL_TABLET | Freq: Every day | ORAL | Status: DC

## 2014-08-07 NOTE — Consult Note (Signed)
[facility] Face-to-Face Psychiatry Consult   Reason for Consult:  asperger and benadryl overdose Referring Physician:  Dr. Clementeen Graham Patient Identification: Bobby Sullivan MRN:  [id number] Principal Diagnosis: Diphenhydramine overdose of undetermined intent Diagnosis:   Patient Active Problem List   Diagnosis Date Noted  . Diphenhydramine overdose of undetermined intent [T45.0X4A] 08/06/2014  . Asperger's syndrome [F84.5] 08/06/2014  . Overdose [T50.901A] 08/05/2014  . Adjustment disorder with emotional disturbance [F43.29] 06/15/2013    Total Time spent with patient: 45 minutes  Subjective:   Bobby Sullivan is a 24 y.o. male patient admitted with behavioral problems.  HPI:  Bobby Sullivan is a 24 y.o. male seen, chart reviewed and case discussed with the hospitalist and clinical social worker for psychiatric consultation and evaluation of questionable overdose of medication. Patient reported he has taken 2 sleeping tablets unisom for insomnia, denied taking Benadryl and his mother concerned about it. Patient reportedly has no symptoms of depression, anxiety, mania, psychosis. Patient has no suicidal or homicidal ideation, intention or plans. Patient has no evidence of psychosis. Patient endorses PMH of Asperger's syndrome and seeing Dr. Lovena Le when he was standing as soon. Patient also reported he graduated from the [employer] school and went for the [employer] for 4 years. Since returning to see [employer] he is trying to find a job and recently found of job of [employer]. Patient reported he has tried different kind of sleeping aids in the past because insomnia his primary problem. He denies suicidal intentions and his mother is not available at the moment, but will be back later.   HPI Elements:   Location:  Asperger and insomnia. Quality:  Fair. Severity:  Questionable overdose on sleeping aide. Timing:  Insomnia. Duration:  Chronic. Context:  Unknown psychosocial stressors.  Past Medical  History:  Past Medical History  Diagnosis Date  . Aspergers' syndrome   . Insomnia    History reviewed. No pertinent past surgical history. Family History: History reviewed. No pertinent family history. Social History:  History  Alcohol Use  . Yes    Comment: occasional     History  Drug Use  . Yes    Comment: Per triple C, benadryl, duster     History   Social History  . Marital Status: Married    Spouse Name: N/A  . Number of Children: N/A  . Years of Education: N/A   Social History Main Topics  . Smoking status: Current Every Day Smoker -- 0.10 packs/day    Types: Cigarettes  . Smokeless tobacco: Not on file  . Alcohol Use: Yes     Comment: occasional  . Drug Use: Yes     Comment: Per triple C, benadryl, duster   . Sexual Activity: Not on file   Other Topics Concern  . None   Social History Narrative   Additional Social History:                          Allergies:  No Known Allergies  Labs:  Results for orders placed or performed during the hospital encounter of 08/05/14 (from the past 48 hour(s))  CBG monitoring, ED     Status: Abnormal   Collection Time: 08/05/14  8:59 PM  Result Value Ref Range   Glucose-Capillary 132 (H) 65 - 99 mg/dL  Comprehensive metabolic panel     Status: Abnormal   Collection Time: 08/05/14  9:04 PM  Result Value Ref Range   Sodium 139 135 - 145 mmol/L  Potassium 3.1 (L) 3.5 - 5.1 mmol/L   Chloride 103 101 - 111 mmol/L   CO2 26 22 - 32 mmol/L   Glucose, Bld 138 (H) 65 - 99 mg/dL   BUN 16 6 - 20 mg/dL   Creatinine, Ser 1.15 0.61 - 1.24 mg/dL   Calcium 9.2 8.9 - 10.3 mg/dL   Total Protein 7.4 6.5 - 8.1 g/dL   Albumin 5.1 (H) 3.5 - 5.0 g/dL   AST 24 15 - 41 U/L   ALT 14 (L) 17 - 63 U/L   Alkaline Phosphatase 36 (L) 38 - 126 U/L   Total Bilirubin 0.4 0.3 - 1.2 mg/dL   GFR calc non Af Amer >60 >60 mL/min   GFR calc Af Amer >60 >60 mL/min    Comment: (NOTE) The eGFR has been calculated using the CKD EPI  equation. This calculation has not been validated in all clinical situations. eGFR's persistently <60 mL/min signify possible Chronic Kidney Disease.    Anion gap 10 5 - 15  Ethanol (ETOH)     Status: None   Collection Time: 08/05/14  9:04 PM  Result Value Ref Range   Alcohol, Ethyl (B) <5 <5 mg/dL    Comment:        LOWEST DETECTABLE LIMIT FOR SERUM ALCOHOL IS 5 mg/dL FOR MEDICAL PURPOSES ONLY   Salicylate level     Status: None   Collection Time: 08/05/14  9:04 PM  Result Value Ref Range   Salicylate Lvl <7.4 2.8 - 30.0 mg/dL  Acetaminophen level     Status: Abnormal   Collection Time: 08/05/14  9:04 PM  Result Value Ref Range   Acetaminophen (Tylenol), Serum <10 (L) 10 - 30 ug/mL    Comment:        THERAPEUTIC CONCENTRATIONS VARY SIGNIFICANTLY. A RANGE OF 10-30 ug/mL MAY BE AN EFFECTIVE CONCENTRATION FOR MANY PATIENTS. HOWEVER, SOME ARE BEST TREATED AT CONCENTRATIONS OUTSIDE THIS RANGE. ACETAMINOPHEN CONCENTRATIONS >150 ug/mL AT 4 HOURS AFTER INGESTION AND >50 ug/mL AT 12 HOURS AFTER INGESTION ARE OFTEN ASSOCIATED WITH TOXIC REACTIONS.   CBC     Status: Abnormal   Collection Time: 08/05/14  9:04 PM  Result Value Ref Range   WBC 12.8 (H) 4.0 - 10.5 K/uL   RBC 4.61 4.22 - 5.81 MIL/uL   Hemoglobin 14.2 13.0 - 17.0 g/dL   HCT 40.2 39.0 - 52.0 %   MCV 87.2 78.0 - 100.0 fL   MCH 30.8 26.0 - 34.0 pg   MCHC 35.3 30.0 - 36.0 g/dL   RDW 12.5 11.5 - 15.5 %   Platelets 318 150 - 400 K/uL  Urine rapid drug screen (hosp performed)     Status: Abnormal   Collection Time: 08/05/14 11:22 PM  Result Value Ref Range   Opiates NONE DETECTED NONE DETECTED   Cocaine NONE DETECTED NONE DETECTED   Benzodiazepines POSITIVE (A) NONE DETECTED   Amphetamines NONE DETECTED NONE DETECTED   Tetrahydrocannabinol NONE DETECTED NONE DETECTED   Barbiturates NONE DETECTED NONE DETECTED    Comment:        DRUG SCREEN FOR MEDICAL PURPOSES ONLY.  IF CONFIRMATION IS NEEDED FOR ANY PURPOSE,  NOTIFY LAB WITHIN 5 DAYS.        LOWEST DETECTABLE LIMITS FOR URINE DRUG SCREEN Drug Class       Cutoff (ng/mL) Amphetamine      1000 Barbiturate      200 Benzodiazepine   163 Tricyclics       845 Opiates  300 Cocaine          300 THC              50   MRSA PCR Screening     Status: None   Collection Time: 08/06/14  2:03 AM  Result Value Ref Range   MRSA by PCR NEGATIVE NEGATIVE    Comment:        The GeneXpert MRSA Assay (FDA approved for NASAL specimens only), is one component of a comprehensive MRSA colonization surveillance program. It is not intended to diagnose MRSA infection nor to guide or monitor treatment for MRSA infections.   Comprehensive metabolic panel     Status: Abnormal   Collection Time: 08/06/14  3:30 AM  Result Value Ref Range   Sodium 137 135 - 145 mmol/L   Potassium 3.8 3.5 - 5.1 mmol/L    Comment: DELTA CHECK NOTED REPEATED TO VERIFY    Chloride 105 101 - 111 mmol/L   CO2 25 22 - 32 mmol/L   Glucose, Bld 95 65 - 99 mg/dL   BUN 12 6 - 20 mg/dL   Creatinine, Ser 0.94 0.61 - 1.24 mg/dL   Calcium 8.3 (L) 8.9 - 10.3 mg/dL   Total Protein 6.4 (L) 6.5 - 8.1 g/dL   Albumin 4.4 3.5 - 5.0 g/dL   AST 19 15 - 41 U/L   ALT 12 (L) 17 - 63 U/L   Alkaline Phosphatase 33 (L) 38 - 126 U/L   Total Bilirubin 0.5 0.3 - 1.2 mg/dL   GFR calc non Af Amer >60 >60 mL/min   GFR calc Af Amer >60 >60 mL/min    Comment: (NOTE) The eGFR has been calculated using the CKD EPI equation. This calculation has not been validated in all clinical situations. eGFR's persistently <60 mL/min signify possible Chronic Kidney Disease.    Anion gap 7 5 - 15  CBC     Status: Abnormal   Collection Time: 08/06/14  3:30 AM  Result Value Ref Range   WBC 12.5 (H) 4.0 - 10.5 K/uL   RBC 4.33 4.22 - 5.81 MIL/uL   Hemoglobin 13.1 13.0 - 17.0 g/dL   HCT 38.5 (L) 39.0 - 52.0 %   MCV 88.9 78.0 - 100.0 fL   MCH 30.3 26.0 - 34.0 pg   MCHC 34.0 30.0 - 36.0 g/dL   RDW 12.5 11.5 -  15.5 %   Platelets 250 150 - 400 K/uL  Magnesium     Status: Abnormal   Collection Time: 08/06/14  3:30 AM  Result Value Ref Range   Magnesium 2.6 (H) 1.7 - 2.4 mg/dL  Phosphorus     Status: None   Collection Time: 08/06/14  3:30 AM  Result Value Ref Range   Phosphorus 3.7 2.5 - 4.6 mg/dL  CBC     Status: None   Collection Time: 08/07/14  5:30 AM  Result Value Ref Range   WBC 7.2 4.0 - 10.5 K/uL   RBC 4.61 4.22 - 5.81 MIL/uL   Hemoglobin 14.1 13.0 - 17.0 g/dL   HCT 41.7 39.0 - 52.0 %   MCV 90.5 78.0 - 100.0 fL   MCH 30.6 26.0 - 34.0 pg   MCHC 33.8 30.0 - 36.0 g/dL   RDW 12.8 11.5 - 15.5 %   Platelets 246 150 - 400 K/uL  Basic metabolic panel     Status: None   Collection Time: 08/07/14  5:30 AM  Result Value Ref Range   Sodium 138 135 -  145 mmol/L   Potassium 4.1 3.5 - 5.1 mmol/L   Chloride 104 101 - 111 mmol/L   CO2 25 22 - 32 mmol/L   Glucose, Bld 80 65 - 99 mg/dL   BUN 12 6 - 20 mg/dL   Creatinine, Ser 0.92 0.61 - 1.24 mg/dL   Calcium 8.9 8.9 - 10.3 mg/dL   GFR calc non Af Amer >60 >60 mL/min   GFR calc Af Amer >60 >60 mL/min    Comment: (NOTE) The eGFR has been calculated using the CKD EPI equation. This calculation has not been validated in all clinical situations. eGFR's persistently <60 mL/min signify possible Chronic Kidney Disease.    Anion gap 9 5 - 15  Magnesium     Status: None   Collection Time: 08/07/14  5:30 AM  Result Value Ref Range   Magnesium 2.1 1.7 - 2.4 mg/dL    Vitals: Blood pressure 98/49, pulse 69, temperature 97.3 F (36.3 C), temperature source Oral, resp. rate 20, height 6' (1.829 m), weight 78.4 kg (172 lb 13.5 oz), SpO2 100 %.  Risk to Self: Is patient at risk for suicide?: Yes Risk to Others:   Prior Inpatient Therapy:   Prior Outpatient Therapy:    Current Facility-Administered Medications  Medication Dose Route Frequency Provider Last Rate Last Dose  . 0.45 % NaCl with KCl 20 mEq / L infusion   Intravenous Continuous Theodis Blaze, MD 75 mL/hr at 08/06/14 1756    . acetaminophen (TYLENOL) tablet 650 mg  650 mg Oral Q6H PRN Gardiner Barefoot, NP   650 mg at 08/06/14 2224  . aspirin EC tablet 81 mg  81 mg Oral Daily Reubin Milan, MD   81 mg at 08/06/14 0920  . enoxaparin (LOVENOX) injection 40 mg  40 mg Subcutaneous Q24H Reubin Milan, MD   40 mg at 08/06/14 0920    Musculoskeletal: Strength & Muscle Tone: within normal limits Gait & Station: normal Patient leans: N/A  Psychiatric Specialty Exam: Physical Exam as per history and physical   ROS stomach upset since he was taken sleeping aide No Fever-chills, No Headache, No changes with Vision or hearing, reports vertigo No problems swallowing food or Liquids, No Chest pain, Cough or Shortness of Breath, No Abdominal pain, No Nausea or Vommitting, Bowel movements are regular, No Blood in stool or Urine, No dysuria, No new skin rashes or bruises, No new joints pains-aches,  No new weakness, tingling, numbness in any extremity, No recent weight gain or loss, No polyuria, polydypsia or polyphagia,   A full 10 point Review of Systems was done, except as stated above, all other Review of Systems were negative.  Blood pressure 98/49, pulse 69, temperature 97.3 F (36.3 C), temperature source Oral, resp. rate 20, height 6' (1.829 m), weight 78.4 kg (172 lb 13.5 oz), SpO2 100 %.Body mass index is 23.44 kg/(m^2).  General Appearance: Casual  Eye Contact::  Good  Speech:  Clear and Coherent  Volume:  Normal  Mood:  Euthymic  Affect:  Appropriate and Congruent  Thought Process:  Coherent and Goal Directed  Orientation:  Full (Time, Place, and Person)  Thought Content:  WDL  Suicidal Thoughts:  No  Homicidal Thoughts:  No  Memory:  Immediate;   Fair Recent;   Fair  Judgement:  Good  Insight:  Good  Psychomotor Activity:  Normal  Concentration:  Good  Recall:  Good  Fund of Knowledge:Good  Language: Good  Akathisia:  Negative  Handed:   Right  AIMS (if indicated):     Assets:  Communication Skills Desire for Improvement Financial Resources/Insurance Housing Intimacy Leisure Time Physical Health Resilience Social Support Talents/Skills Transportation Vocational/Educational  ADL's:  Intact  Cognition: WNL  Sleep:      Medical Decision Making: Review of Psycho-Social Stressors (1), Review or order clinical lab tests (1), New Problem, with no additional work-up planned (3), Review of Last Therapy Session (1), Review or order medicine tests (1), Review of Medication Regimen & Side Effects (2) and Review of New Medication or Change in Dosage (2)  Treatment Plan Summary: Patient endorses having diagnosis of Asperger's since he was standing years old and chronic insomnia and trying to take a different sleeping aids over the years. Patient denies current intentional overdose of medication and also denied active suicidal/homicidal ideation, intention or plan. Patient has no evidence of psychosis.  Daily contact with patient to assess and evaluate symptoms and progress in treatment and Medication management  Plan:  Safety concerns: Patient has no current safety concerns Patient does not meet criteria for psychiatric inpatient admission. Supportive therapy provided about ongoing stressors. Appreciate psychiatric consultation and we sign off at this time Please contact [phone] or [phone] if needs further assistance  Disposition: Referred to the outpatient counseling services when medically stable  Bobby Sullivan,JANARDHAHA R. 08/07/2014 12:24 PM

## 2014-08-07 NOTE — Discharge Summary (Signed)
Physician Discharge Summary  Bobby Sullivan ZOX:[id number] DOB: 1990/09/10 DOA: 08/05/2014  PCP: No PCP Per Patient  Admit date: 08/05/2014 Discharge date: 08/07/2014  Time spent: 30 minutes  Recommendations for Outpatient Follow-up:  1. Discharge home with outpatient follow-up appointment with Dr. Thedore MinsMojeed Akintayo on 08/16/2014 at 3:45pm 2. She is being discharged on Ativan 2-4 mg daily at bedtime for severe insomnia. Patient's mother has clearly assured that she will be giving the medication to the patient and it will be out of his reach to avoid  potential overdose.  Discharge Diagnoses:  Principal Problem:   Diphenhydramine overdose of undetermined intent  Active Problems:   Asperger's syndrome   Insomnia, uncontrolled   Discharge Condition: FAIR  Diet recommendation: regular  Filed Weights   08/06/14 0112  Weight: 78.4 kg (172 lb 13.5 oz)    History of present illness:  24 year old male with history of Asperger's syndrome with severe insomnia presented with overdose of diphenhydramine. As per mother he has been involuntarily committed almost 6 times in the past 1 year. Mother also explains that patient has severe insomnia and has not responded to Ambien, trazodone or small doses of diphenhydramine for sleep. He usually is able to sleep only up to 3 hours a night and becomes restless after several days of sleeplessness. she thinks that because of this he went out to buy Benadryl and overdosed on it. Patient previously has overdosed with Benadryl and caffeine for similar symptoms. Patient reports taking only 2 tablets of Benadryl. In the ED vitals were stable. Labs showed mild leukocytosis and hypokalemia. Patient admitted to step down unit transfer to medical floor the next morning.   Hospital Course:  Benadryl overdose Patient hemodynamically stable. No withdrawal signs or symptoms. On detailed discussion with his mother over the phone it appears that patient has severe  insomnia with underlying Asperger syndrome for which he has tried different medications without much help and has overdosed on Benadryl and caffeine in the past as well. She clearly denies any suicidal or homicidal ideations. Seen by psychiatry and patient cleared as there are no safety concerns. -I have arranged for an outpatient appointment with neuropsychiatry Dr.Akintayo next week as per patient's mother's request to address his Asperger syndrome and chronic insomnia. In the meantime I will prescribe him oral Ativan (2-4 mg at bedtime) for his  insomnia. According to his mother patient has responded to Ativan in the past.  Hypokalemia Replenish  Asperger syndrome As outlined above  Diet: Regular  Family communication: Spoke with mother at length on the phone   Procedures None    Discharge Exam: Filed Vitals:   08/07/14 0515  BP: 98/49  Pulse: 69  Temp: 97.3 F (36.3 C)  Resp: 20    General: Young male in no acute distress  HEENT: No pallor, muscular mucosa, supple neck Chest: Clear to edition bilaterally CVS: Normal S1 and S2, no murmurs  GI: Soft, nondistended, nontender, bowel sounds present Musculoskeletal: Warm, no edema CNS: Alert and oriented  Discharge Instructions    Current Discharge Medication List    START taking these medications   Details  LORazepam (ATIVAN) 2 MG tablet Take 1 tablet (2 mg total) by mouth at bedtime. May go up to 4 mg ( 2 tablets) a day if insomnia  persists Qty: 15 tablet, Refills: 0      STOP taking these medications     diphenhydrAMINE (BENADRYL) 50 MG capsule        No Known Allergies Follow-up Information  Follow up with Akintayo, Mojeed On 08/16/2014.   Specialty:  Psychiatry   Why:  3:45 pm   Contact information:   [address] [place] [address] [phone]        The results of significant diagnostics from this hospitalization (including imaging, microbiology, ancillary and laboratory) are  listed below for reference.    Significant Diagnostic Studies: No results found.  Microbiology: Recent Results (from the past 240 hour(s))  MRSA PCR Screening     Status: None   Collection Time: 08/06/14  2:03 AM  Result Value Ref Range Status   MRSA by PCR NEGATIVE NEGATIVE Final    Comment:        The GeneXpert MRSA Assay (FDA approved for NASAL specimens only), is one component of a comprehensive MRSA colonization surveillance program. It is not intended to diagnose MRSA infection nor to guide or monitor treatment for MRSA infections.      Labs: Basic Metabolic Panel:  Recent Labs Lab 08/05/14 2104 08/06/14 0330 08/07/14 0530  NA 139 137 138  K 3.1* 3.8 4.1  CL 103 105 104  CO2 GLUCOSE 138* 95 80  BUN CREATININE 1.15 0.94 0.92  CALCIUM 9.2 8.3* 8.9  MG  --  2.6* 2.1  PHOS  --  3.7  --    Liver Function Tests:  Recent Labs Lab 08/05/14 2104 08/06/14 0330  AST 24 19  ALT 14* 12*  ALKPHOS 36* 33*  BILITOT 0.4 0.5  PROT 7.4 6.4*  ALBUMIN 5.1* 4.4   No results for input(s): LIPASE, AMYLASE in the last 168 hours. No results for input(s): AMMONIA in the last 168 hours. CBC:  Recent Labs Lab 08/05/14 2104 08/06/14 0330 08/07/14 0530  WBC 12.8* 12.5* 7.2  HGB 14.2 13.1 14.1  HCT 40.2 38.5* 41.7  MCV 87.2 88.9 90.5  PLT 318 250 246   Cardiac Enzymes: No results for input(s): CKTOTAL, CKMB, CKMBINDEX, TROPONINI in the last 168 hours. BNP: BNP (last 3 results) No results for input(s): BNP in the last 8760 hours.  ProBNP (last 3 results) No results for input(s): PROBNP in the last 8760 hours.  CBG:  Recent Labs Lab 08/05/14 2059  GLUCAP 132*       Signed:  Jenell Dobransky  [facility] 08/07/2014, 3:09 PM

## 2014-08-07 NOTE — Progress Notes (Signed)
Discharge instructions give to pt, verbalized understanding. Left the unit in stable condition. 

## 2014-12-16 ENCOUNTER — Observation Stay (HOSPITAL_COMMUNITY)
Admission: EM | Admit: 2014-12-16 | Discharge: 2014-12-17 | Disposition: A | Discharge status: Home / Self Care | Payer: Self-pay | Attending: Internal Medicine | Admitting: Internal Medicine

## 2014-12-16 ENCOUNTER — Emergency Department (HOSPITAL_COMMUNITY): Payer: Self-pay

## 2014-12-16 ENCOUNTER — Encounter (HOSPITAL_COMMUNITY): Payer: Self-pay | Admitting: Radiology

## 2014-12-16 DIAGNOSIS — T450X2A Poisoning by antiallergic and antiemetic drugs, intentional self-harm, initial encounter: Principal | ICD-10-CM | POA: Insufficient documentation

## 2014-12-16 DIAGNOSIS — Z79899 Other long term (current) drug therapy: Secondary | ICD-10-CM | POA: Insufficient documentation

## 2014-12-16 DIAGNOSIS — R000 Tachycardia, unspecified: Secondary | ICD-10-CM

## 2014-12-16 DIAGNOSIS — G4700 Insomnia, unspecified: Secondary | ICD-10-CM | POA: Diagnosis present

## 2014-12-16 DIAGNOSIS — D72829 Elevated white blood cell count, unspecified: Secondary | ICD-10-CM | POA: Diagnosis present

## 2014-12-16 DIAGNOSIS — F329 Major depressive disorder, single episode, unspecified: Secondary | ICD-10-CM | POA: Insufficient documentation

## 2014-12-16 DIAGNOSIS — R9431 Abnormal electrocardiogram [ECG] [EKG]: Secondary | ICD-10-CM

## 2014-12-16 DIAGNOSIS — T450X4D Poisoning by antiallergic and antiemetic drugs, undetermined, subsequent encounter: Secondary | ICD-10-CM

## 2014-12-16 DIAGNOSIS — I4581 Long QT syndrome: Secondary | ICD-10-CM | POA: Insufficient documentation

## 2014-12-16 DIAGNOSIS — T450X1A Poisoning by antiallergic and antiemetic drugs, accidental (unintentional), initial encounter: Secondary | ICD-10-CM | POA: Diagnosis present

## 2014-12-16 DIAGNOSIS — F845 Asperger's syndrome: Secondary | ICD-10-CM | POA: Diagnosis present

## 2014-12-16 DIAGNOSIS — F17210 Nicotine dependence, cigarettes, uncomplicated: Secondary | ICD-10-CM | POA: Insufficient documentation

## 2014-12-16 LAB — COMPREHENSIVE METABOLIC PANEL WITH GFR
ALT: 15 U/L — ABNORMAL LOW (ref 17–63)
AST: 23 U/L (ref 15–41)
Albumin: 4.8 g/dL (ref 3.5–5.0)
Alkaline Phosphatase: 39 U/L (ref 38–126)
Anion gap: 8 (ref 5–15)
BUN: 19 mg/dL (ref 6–20)
CO2: 25 mmol/L (ref 22–32)
Calcium: 9.3 mg/dL (ref 8.9–10.3)
Chloride: 105 mmol/L (ref 101–111)
Creatinine, Ser: 1.2 mg/dL (ref 0.61–1.24)
GFR calc Af Amer: >60 mL/min
GFR calc non Af Amer: >60 mL/min
Glucose, Bld: 106 mg/dL — ABNORMAL HIGH (ref 65–99)
Potassium: 3.5 mmol/L (ref 3.5–5.1)
Sodium: 138 mmol/L (ref 135–145)
Total Bilirubin: 0.5 mg/dL (ref 0.3–1.2)
Total Protein: 7.5 g/dL (ref 6.5–8.1)

## 2014-12-16 LAB — RAPID URINE DRUG SCREEN, HOSP PERFORMED
AMPHETAMINES: NOT DETECTED
BARBITURATES: NOT DETECTED
Benzodiazepines: POSITIVE — AB
Cocaine: NOT DETECTED
OPIATES: NOT DETECTED
TETRAHYDROCANNABINOL: NOT DETECTED

## 2014-12-16 LAB — CBC WITH DIFFERENTIAL/PLATELET
BASOS ABS: 0.1 10*3/uL (ref 0.0–0.1)
BASOS PCT: 0 %
EOS ABS: 0.3 10*3/uL (ref 0.0–0.7)
Eosinophils Relative: 3 %
HCT: 42.2 % (ref 39.0–52.0)
HEMOGLOBIN: 14.9 g/dL (ref 13.0–17.0)
Lymphocytes Relative: 12 %
Lymphs Abs: 1.5 10*3/uL (ref 0.7–4.0)
MCH: 31.2 pg (ref 26.0–34.0)
MCHC: 35.3 g/dL (ref 30.0–36.0)
MCV: 88.5 fL (ref 78.0–100.0)
MONO ABS: 1.2 10*3/uL — AB (ref 0.1–1.0)
MONOS PCT: 9 %
NEUTROS ABS: 10 10*3/uL — AB (ref 1.7–7.7)
NEUTROS PCT: 76 %
Platelets: 280 10*3/uL (ref 150–400)
RBC: 4.77 MIL/uL (ref 4.22–5.81)
RDW: 12.5 % (ref 11.5–15.5)
WBC: 13 10*3/uL — ABNORMAL HIGH (ref 4.0–10.5)

## 2014-12-16 LAB — ACETAMINOPHEN LEVEL: Acetaminophen (Tylenol), Serum: <10 ug/mL — ABNORMAL LOW (ref 10–30)

## 2014-12-16 LAB — SALICYLATE LEVEL: Salicylate Lvl: <4 mg/dL (ref 2.8–30.0)

## 2014-12-16 LAB — ETHANOL: Alcohol, Ethyl (B): <5 mg/dL

## 2014-12-16 MED ORDER — LORAZEPAM 1 MG PO TABS
2.0000 mg | ORAL_TABLET | Freq: Every day | ORAL | Status: DC
  Administered 2014-12-17: 2 mg via ORAL
  Filled 2014-12-16: qty 2

## 2014-12-16 MED ORDER — SODIUM CHLORIDE 0.9 % IJ SOLN
3.0000 mL | Freq: Two times a day (BID) | INTRAMUSCULAR | Status: DC
  Administered 2014-12-17: 3 mL via INTRAVENOUS

## 2014-12-16 MED ORDER — SODIUM CHLORIDE 0.9 % IV SOLN
INTRAVENOUS | Status: DC
  Administered 2014-12-17 (×2): via INTRAVENOUS

## 2014-12-16 MED ORDER — ENOXAPARIN SODIUM 40 MG/0.4ML ~~LOC~~ SOLN
40.0000 mg | Freq: Every day | SUBCUTANEOUS | Status: DC
  Administered 2014-12-17: 40 mg via SUBCUTANEOUS
  Filled 2014-12-16 (×2): qty 0.4

## 2014-12-16 MED ORDER — LORAZEPAM 2 MG/ML IJ SOLN: 1.0000 mg | INTRAMUSCULAR | Status: DC | PRN

## 2014-12-16 MED ORDER — LORAZEPAM 2 MG/ML IJ SOLN
1.0000 mg | Freq: Once | INTRAMUSCULAR | Status: AC
  Administered 2014-12-16: 1 mg via INTRAVENOUS
  Filled 2014-12-16: qty 1

## 2014-12-16 MED ORDER — SODIUM CHLORIDE 0.9 % IV SOLN: INTRAVENOUS | Status: DC

## 2014-12-16 NOTE — ED Notes (Signed)
[employer] served IVC paperwork  

## 2014-12-16 NOTE — H&P (Signed)
[facility] History and Physical  Bobby Sullivan ZOX:[id number] DOB: [birth year] DOA: 12/16/2014  Referring physician: ED PCP: No PCP Per Patient   Chief Complaint: Overdose  HPI:  24 y/o Male with PMH of Asperger's disorder, benadryl overdose, and seizures reported 2/2 bendryl; who presents for recurrent benadryl overdose.  History is obtain from the patient's mother, Aileen Fass at [phone].  She reports that this afternoon she found a trash bag with empty red bull, [employer] cup, and two empty pill bottles of sleeping pills with active ingredient of diphenhydramine. She reports multiple episodes like this in the past where the patient has tried to overdose last hospitalization was in August 05, 2014 . Symptoms were related to him recently on to see his father this past weekend. She reports that the father often belittles the patient and makes him feel bad because of his Asperger's condition. Yesterday, the patient was reported saying," I think I'll do what Caryn Bee did and blow my brains out". She reports that the combination of Benadryl and caffeine has caused him to have seizures in the past. At baseline the patient lacks many social cues and is generally clumsy. 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     Review of Systems  Constitutional: Negative for chills and weight loss.  HENT: Negative for ear pain, hearing loss and tinnitus.   Eyes: Negative for double vision and photophobia.  Respiratory: Negative for cough, hemoptysis and sputum production.   Cardiovascular: Negative for chest pain, palpitations and claudication.  Gastrointestinal: Negative for nausea, vomiting and abdominal pain.  Genitourinary: Positive for frequency. Negative for dysuria and urgency.  Skin: Negative for itching and rash.  Neurological: Positive for tremors. Negative for sensory change.  Endo/Heme/Allergies: Negative for  environmental allergies and polydipsia.  Psychiatric/Behavioral: Positive for substance abuse. The patient is nervous/anxious and has insomnia.       Past Medical History  Diagnosis Date  . Aspergers' syndrome   . Insomnia      No past surgical history on file.    Social History:  reports that he has been smoking Cigarettes.  He has been smoking about 0.10 packs per day. He does not have any smokeless tobacco history on file. He reports that he drinks alcohol. He reports that he uses illicit drugs. Where does patient live--Home  and with whom if at home? Mother Can patient participate in ADLs? YES  No Known Allergies  Family History  Problem Relation Age of Onset  . Bipolar disorder Mother        Prior to Admission medications   Medication Sig Start Date End Date Taking? Authorizing Provider  LORazepam (ATIVAN) 2 MG tablet Take 1 tablet (2 mg total) by mouth at bedtime. May go up to 4 mg ( 2 tablets) a day if insomnia  persists 08/07/14  Yes Eddie North, MD     Physical Exam: Filed Vitals:   12/16/14 2130 12/16/14 2145 12/16/14 2215 12/16/14 2230  BP: 154/98 152/74 141/98 138/96  Pulse: 131 121 115 129  Temp:      TempSrc:      Resp: SpO2: 100% 98% 96% 97%     Constitutional: Vital signs reviewed. Patient is a well-developed and well-nourished in no acute distress and cooperative with exam. Alert and oriented to person and place. Not oriented to situation.  Head: Normocephalic and atraumatic  Ear: TM normal bilaterally  Mouth:  no erythema or exudates, MMM  Eyes: PERRL, EOMI, conjunctivae normal, No scleral icterus.  Neck: Supple, Trachea midline normal ROM, No JVD, mass, thyromegaly, or carotid bruit present.  Cardiovascular:Tachycardiac, S1 normal, S2 normal, no MRG, pulses symmetric and intact bilaterally  Pulmonary/Chest: CTAB, no wheezes, rales, or rhonchi  Abdominal: Soft. Non-tender, non-distended, bowel sounds are normal, no masses,  organomegaly, or guarding present.  GU: no CVA tenderness Musculoskeletal: No joint deformities, erythema, or stiffness, ROM full and no nontender Ext: no edema and no cyanosis, pulses palpable bilaterally (DP and PT)  Hematology: no cervical, inginal, or axillary adenopathy.  Neurological: A&O x3, Strenght is normal and symmetric bilaterally, cranial nerve II-XII are grossly intact, no focal motor deficit, sensory intact to light touch bilaterally.  Skin: Warm, dry and intact. No rash, cyanosis, or clubbing.  Psychiatric: Normal mood and affect. speech and behavior is normal. Judgment and thought content normal. Cognition and memory are normal.      Data Review   Micro Results No results found for this or any previous visit (from the past 240 hour(s)).  Radiology Reports Ct Head Wo Contrast  12/16/2014  CLINICAL DATA:  Patient fell twice in the kitchen striking head on the dishwasher. Possible seizure prior to fall. Possible overdose of Benadryl. EXAM: CT HEAD WITHOUT CONTRAST CT CERVICAL SPINE WITHOUT CONTRAST TECHNIQUE: Multidetector CT imaging of the head and cervical spine was performed following the standard protocol without intravenous contrast. Multiplanar CT image reconstructions of the cervical spine were also generated. COMPARISON:  CT head 12/17/2013 FINDINGS: CT HEAD FINDINGS Ventricles and sulci appear symmetrical. No ventricular dilatation. No mass effect or midline shift. No abnormal extra-axial fluid collections. Gray-white matter junctions are distinct. Basal cisterns are not effaced. No evidence of acute intracranial hemorrhage. No depressed skull fractures. Visualized paranasal sinuses and mastoid air cells are not opacified. CT CERVICAL SPINE FINDINGS Normal alignment of the cervical spine. Intervertebral disc space heights are preserved. C1-2 articulation appears intact. No vertebral compression deformities. No prevertebral soft tissue swelling. No focal bone lesion or bone  destruction. Soft tissues are unremarkable. IMPRESSION: No acute intracranial abnormalities. Normal alignment of the cervical spine. No acute displaced fractures identified. Electronically Signed   By: Burman NievesWilliam  Stevens M.D.   On: 12/16/2014 21:06   Ct Cervical Spine Wo Contrast  12/16/2014  CLINICAL DATA:  Patient fell twice in the kitchen striking head on the dishwasher. Possible seizure prior to fall. Possible overdose of Benadryl. EXAM: CT HEAD WITHOUT CONTRAST CT CERVICAL SPINE WITHOUT CONTRAST TECHNIQUE: Multidetector CT imaging of the head and cervical spine was performed following the standard protocol without intravenous contrast. Multiplanar CT image reconstructions of the cervical spine were also generated. COMPARISON:  CT head 12/17/2013 FINDINGS: CT HEAD FINDINGS Ventricles and sulci appear symmetrical. No ventricular dilatation. No mass effect or midline shift. No abnormal extra-axial fluid collections. Gray-white matter junctions are distinct. Basal cisterns are not effaced. No evidence of acute intracranial hemorrhage. No depressed skull fractures. Visualized paranasal sinuses and mastoid air cells are not opacified. CT CERVICAL SPINE FINDINGS Normal alignment of the cervical spine. Intervertebral disc space heights are preserved. C1-2 articulation appears intact. No vertebral compression deformities. No prevertebral soft tissue swelling. No focal bone lesion or bone destruction. Soft tissues are unremarkable. IMPRESSION: No acute intracranial abnormalities. Normal alignment of the cervical spine. No acute displaced fractures identified. Electronically Signed   By: Burman NievesWilliam  Stevens M.D.   On: 12/16/2014 21:06     CBC  Recent Labs Lab 12/16/14 2000  WBC 13.0*  HGB 14.9  HCT 42.2  PLT 280  MCV 88.5  MCH 31.2  MCHC 35.3  RDW 12.5  LYMPHSABS 1.5  MONOABS 1.2*  EOSABS 0.3  BASOSABS 0.1    Chemistries   Recent Labs Lab 12/16/14 2000  NA 138  K 3.5  CL 105  CO2 25  GLUCOSE  106*  BUN 19  CREATININE 1.20  CALCIUM 9.3  AST 23  ALT 15*  ALKPHOS 39  BILITOT 0.5   ------------------------------------------------------------------------------------------------------------------ CrCl cannot be calculated (Unknown ideal weight.). ------------------------------------------------------------------------------------------------------------------ No results for input(s): HGBA1C in the last 72 hours. ------------------------------------------------------------------------------------------------------------------ No results for input(s): CHOL, HDL, LDLCALC, TRIG, CHOLHDL, LDLDIRECT in the last 72 hours. ------------------------------------------------------------------------------------------------------------------ No results for input(s): TSH, T4TOTAL, T3FREE, THYROIDAB in the last 72 hours.  Invalid input(s): FREET3 ------------------------------------------------------------------------------------------------------------------ No results for input(s): VITAMINB12, FOLATE, FERRITIN, TIBC, IRON, RETICCTPCT in the last 72 hours.  Coagulation profile No results for input(s): INR, PROTIME in the last 168 hours.  No results for input(s): DDIMER in the last 72 hours.  Cardiac Enzymes No results for input(s): CKMB, TROPONINI, MYOGLOBIN in the last 168 hours.  Invalid input(s): CK ------------------------------------------------------------------------------------------------------------------ Invalid input(s): POCBNP   CBG: No results for input(s): GLUCAP in the last 168 hours.     EKG: Independently reviewed.    Assessment/Plan Principal Problem:    Diphenhydramine overdose: Recurrent issue. Patient has been noted to have suicidal intent per mother. Portion control was contacted and stated to monitor overnight and provide benzos for agitation. -Admit to telemetry unit for close cardiac monitoring of potential Diphenhydramine induced arrhythmias. -prn  Ativan for agitation -Continue IV fluids  -Recheck BMP, Mag, phos -Please consider Behavioral health consult -sitter at bedside  Asperger's syndrome: Stable  Depression:  Acute on chronic. Patient with reported suicidal ideations prior to admission. Patient currently denies any thoughts wanting to hurt himself. - Child psychotherapist consult in a.m.   Leukocytosis: acute. Unclear etiology at this time -Recheck CBC in a.m.    Sinus tachycardia (HCC) heart rates in the 130s to 140s while in the emergency department. Suspect secondary to the combination of caffeine and diphenhydramine which patient consumed. -Continue telemetry    Prolonged Q-T interval on ECG -Repeat EKG in a.m. -correct for any electrolyte abnormalities   Insomnia, uncontrolled: Patient currently on nothing at home to help with sleep and insomnia issues. -ativan prn sleep , agitation, anxiety while in hospital setting. Would possibly consider a non-benzodiazepine in outpatient setting   -DVT prophylaxis. Code Status:   full Family Communication: bedside Disposition Plan: admit   Total time spent 55 minutes.Greater than 50% of this time was spent in counseling, explanation of diagnosis, planning of further management, and coordination of care  Clydie Braun [facility] Pager [phone]  If 7PM-7AM, please contact night-coverage [email or web address] Password [facility] 12/16/2014, 10:33 PM

## 2014-12-16 NOTE — ED Provider Notes (Signed)
CSN: [id number]     Arrival date & time 12/16/14  1903 History   First MD Initiated Contact with Patient 12/16/14 1920     Chief Complaint  Patient presents with  . Drug Overdose  . IVC      (Consider location/radiation/quality/duration/timing/severity/associated sxs/prior Treatment) Patient is a 24 y.o. male presenting with Overdose. The history is provided by the patient.  Drug Overdose This is a recurrent problem.   patient reportedly overdosed on Benadryl. 50 mg tabs and possibly had 16 of them. Unknown time. Patient is altered unable to give much history. Required some Versed by EMS. Previous history of overdose on Benadryl. Still confused and unable to give me any history. Reportedly also fell and hit his head.  Past Medical History  Diagnosis Date  . Aspergers' syndrome   . Insomnia    No past surgical history on file. Family History  Problem Relation Age of Onset  . Bipolar disorder Mother    Social History  Substance Use Topics  . Smoking status: Current Every Day Smoker -- 0.10 packs/day    Types: Cigarettes  . Smokeless tobacco: None  . Alcohol Use: Yes     Comment: occasional    Review of Systems  Unable to perform ROS: Mental status change      Allergies  Review of patient's allergies indicates no known allergies.  Home Medications   Prior to Admission medications   Medication Sig Start Date End Date Taking? Authorizing Provider  LORazepam (ATIVAN) 2 MG tablet Take 1 tablet (2 mg total) by mouth at bedtime. May go up to 4 mg ( 2 tablets) a day if insomnia  persists 08/07/14  Yes Nishant Dhungel, MD   BP 158/112 mmHg  Pulse 125  Temp(Src) 98.8 F (37.1 C) (Oral)  Resp 20  Ht 6' (1.829 m)  Wt 168 lb (76.204 kg)  BMI 22.78 kg/m2  SpO2 96% Physical Exam  Constitutional: He appears well-developed.  HENT:  Head: Atraumatic.  Eyes: Pupils are equal, round, and reactive to light.  Neck: Neck supple.  No midline tenderness  Cardiovascular:   Tachycardia  Pulmonary/Chest: Effort normal.  Abdominal: There is no tenderness.  Musculoskeletal: Normal range of motion.  Neurological:  Patient's confused. Able answer questions but somewhat nonsensically.  Skin: Skin is warm.    ED Course  Procedures (including critical care time) Labs Review Labs Reviewed  COMPREHENSIVE METABOLIC PANEL - Abnormal; Notable for the following:    Glucose, Bld 106 (*)    ALT 15 (*)    All other components within normal limits  ACETAMINOPHEN LEVEL - Abnormal; Notable for the following:    Acetaminophen (Tylenol), Serum <10 (*)    All other components within normal limits  URINE RAPID DRUG SCREEN, HOSP PERFORMED - Abnormal; Notable for the following:    Benzodiazepines POSITIVE (*)    All other components within normal limits  CBC WITH DIFFERENTIAL/PLATELET - Abnormal; Notable for the following:    WBC 13.0 (*)    Neutro Abs 10.0 (*)    Monocytes Absolute 1.2 (*)    All other components within normal limits  ETHANOL  SALICYLATE LEVEL  BASIC METABOLIC PANEL  MAGNESIUM  PHOSPHORUS  CBC    Imaging Review Ct Head Wo Contrast  12/16/2014  CLINICAL DATA:  Patient fell twice in the kitchen striking head on the dishwasher. Possible seizure prior to fall. Possible overdose of Benadryl. EXAM: CT HEAD WITHOUT CONTRAST CT CERVICAL SPINE WITHOUT CONTRAST TECHNIQUE: Multidetector CT imaging of the  head and cervical spine was performed following the standard protocol without intravenous contrast. Multiplanar CT image reconstructions of the cervical spine were also generated. COMPARISON:  CT head 12/17/2013 FINDINGS: CT HEAD FINDINGS Ventricles and sulci appear symmetrical. No ventricular dilatation. No mass effect or midline shift. No abnormal extra-axial fluid collections. Gray-white matter junctions are distinct. Basal cisterns are not effaced. No evidence of acute intracranial hemorrhage. No depressed skull fractures. Visualized paranasal sinuses and  mastoid air cells are not opacified. CT CERVICAL SPINE FINDINGS Normal alignment of the cervical spine. Intervertebral disc space heights are preserved. C1-2 articulation appears intact. No vertebral compression deformities. No prevertebral soft tissue swelling. No focal bone lesion or bone destruction. Soft tissues are unremarkable. IMPRESSION: No acute intracranial abnormalities. Normal alignment of the cervical spine. No acute displaced fractures identified. Electronically Signed   By: Burman Nieves M.D.   On: 12/16/2014 21:06   Ct Cervical Spine Wo Contrast  12/16/2014  CLINICAL DATA:  Patient fell twice in the kitchen striking head on the dishwasher. Possible seizure prior to fall. Possible overdose of Benadryl. EXAM: CT HEAD WITHOUT CONTRAST CT CERVICAL SPINE WITHOUT CONTRAST TECHNIQUE: Multidetector CT imaging of the head and cervical spine was performed following the standard protocol without intravenous contrast. Multiplanar CT image reconstructions of the cervical spine were also generated. COMPARISON:  CT head 12/17/2013 FINDINGS: CT HEAD FINDINGS Ventricles and sulci appear symmetrical. No ventricular dilatation. No mass effect or midline shift. No abnormal extra-axial fluid collections. Gray-white matter junctions are distinct. Basal cisterns are not effaced. No evidence of acute intracranial hemorrhage. No depressed skull fractures. Visualized paranasal sinuses and mastoid air cells are not opacified. CT CERVICAL SPINE FINDINGS Normal alignment of the cervical spine. Intervertebral disc space heights are preserved. C1-2 articulation appears intact. No vertebral compression deformities. No prevertebral soft tissue swelling. No focal bone lesion or bone destruction. Soft tissues are unremarkable. IMPRESSION: No acute intracranial abnormalities. Normal alignment of the cervical spine. No acute displaced fractures identified. Electronically Signed   By: Burman Nieves M.D.   On: 12/16/2014 21:06    I have personally reviewed and evaluated these images and lab results as part of my medical decision-making.   EKG Interpretation   Date/Time:  Monday December 16 2014 19:44:28 EST Ventricular Rate:  130 PR Interval:  128 QRS Duration: 106 QT Interval:  434 QTC Calculation: 638 R Axis:   84 Text Interpretation:  Sinus tachycardia Consider right atrial enlargement  Consider right ventricular hypertrophy Prolonged QT interval Confirmed by  Rubin Payor  MD, Harrold Donath [phone]) on 12/16/2014 7:55:35 PM      MDM   Final diagnoses:  Diphenhydramine overdose of undetermined intent, subsequent encounter    Patient Benadryl overdose. Unknown cause. Has tachycardia. Still confused. Mental status improved somewhat. Continue tachycardia and will admit to internal medicine. Discussed with poison control.    Benjiman Core, MD 12/17/14 [phone]

## 2014-12-16 NOTE — ED Notes (Addendum)
Per pt mother, pt went to [place] to visit father on yesterday. States usually when he comes back he is depressed. She states, he mentioned his best friend who had committed suicide and then this happened. She said he visited his father and usually that doesn't go well. Pt is working with a Nutritional therapistplumber and will get his certification soon. Mother states that father is an Art gallery managerengineer and that having a Software engineerplumber certification isn't good enough.

## 2014-12-16 NOTE — ED Notes (Signed)
While log rolling patient off board, he became physically aggressive toward staff.

## 2014-12-16 NOTE — ED Notes (Signed)
Family at bedside. Sitter at bedside.

## 2014-12-16 NOTE — ED Notes (Signed)
Family at bedside. 

## 2014-12-16 NOTE — ED Notes (Signed)
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 Per EMS brother reported he came into the kitchen very jittery, fell twice and hit his head on the dishwasher. Brother states he may have had a seizure with his head shaking back and forth without seeming to lose consciousness. Found a [employer] bag with two empty boxes of 'Sleep Aid' (Dephenhydramine HCl 50mg , 8 liquid-filled capsules in each box), and an empty redbull with patient. EMS states the patient will just stare when asked questions.   EMS got orders to give 2 Versed IV at 18:40 in route.

## 2014-12-16 NOTE — ED Notes (Signed)
Claris GowerRhonda Rosser: mother: [phone]

## 2014-12-16 NOTE — ED Notes (Signed)
Bed: WA06 Expected date:  Expected time:  Means of arrival:  Comments: OD on benadryl

## 2014-12-17 ENCOUNTER — Encounter (HOSPITAL_COMMUNITY): Payer: Self-pay

## 2014-12-17 DIAGNOSIS — T1491 Suicide attempt: Secondary | ICD-10-CM

## 2014-12-17 DIAGNOSIS — T450X3S Poisoning by antiallergic and antiemetic drugs, assault, sequela: Secondary | ICD-10-CM

## 2014-12-17 DIAGNOSIS — T450X4D Poisoning by antiallergic and antiemetic drugs, undetermined, subsequent encounter: Secondary | ICD-10-CM

## 2014-12-17 LAB — CBC
HEMATOCRIT: 38 % — AB (ref 39.0–52.0)
Hemoglobin: 12.9 g/dL — ABNORMAL LOW (ref 13.0–17.0)
MCH: 30.9 pg (ref 26.0–34.0)
MCHC: 33.9 g/dL (ref 30.0–36.0)
MCV: 90.9 fL (ref 78.0–100.0)
Platelets: 277 10*3/uL (ref 150–400)
RBC: 4.18 MIL/uL — AB (ref 4.22–5.81)
RDW: 12.8 % (ref 11.5–15.5)
WBC: 13.5 10*3/uL — ABNORMAL HIGH (ref 4.0–10.5)

## 2014-12-17 LAB — BASIC METABOLIC PANEL
ANION GAP: 9 (ref 5–15)
BUN: 13 mg/dL (ref 6–20)
CO2: 25 mmol/L (ref 22–32)
Calcium: 8.6 mg/dL — ABNORMAL LOW (ref 8.9–10.3)
Chloride: 105 mmol/L (ref 101–111)
Creatinine, Ser: 1.08 mg/dL (ref 0.61–1.24)
Glucose, Bld: 88 mg/dL (ref 65–99)
POTASSIUM: 3.2 mmol/L — AB (ref 3.5–5.1)
SODIUM: 139 mmol/L (ref 135–145)

## 2014-12-17 LAB — MAGNESIUM: MAGNESIUM: 1.8 mg/dL (ref 1.7–2.4)

## 2014-12-17 LAB — PHOSPHORUS: PHOSPHORUS: 3.7 mg/dL (ref 2.5–4.6)

## 2014-12-17 MED ORDER — CETYLPYRIDINIUM CHLORIDE 0.05 % MT LIQD
7.0000 mL | Freq: Two times a day (BID) | OROMUCOSAL | Status: DC
  Administered 2014-12-17: 7 mL via OROMUCOSAL

## 2014-12-17 NOTE — Consult Note (Signed)
[facility] Consult   Reason for Consult:  Benadryl overdose Referring Physician:  Dr. Charlies Silvers Patient Identification: Cleve Paolillo MRN:  [id number] Principal Diagnosis: Diphenhydramine overdose Diagnosis:   Patient Active Problem List   Diagnosis Date Noted  . Leukocytosis [D72.829] 12/16/2014  . Diphenhydramine overdose [T45.0X1A] 12/16/2014  . Sinus tachycardia ([place]) [R00.0] 12/16/2014  . Prolonged Q-T interval on ECG [I45.81] 12/16/2014  . Insomnia, uncontrolled [G47.00] 08/07/2014  . Diphenhydramine overdose of undetermined intent [T45.0X4A] 08/06/2014  . Asperger's syndrome [F84.5] 08/06/2014  . Adjustment disorder with emotional disturbance [F43.29] 06/15/2013    Total Time spent with patient: 1 hour  Subjective:   Bobby Sullivan is a 24 y.o. male patient admitted with intentional benadryl overdose.  HPI:  Bobby Sullivan is a 24 years old young male admitted to [facility] has a status post intentional Benadryl overdose. Patient reported he could not sleep well so he has decided to place of Benadryl which caused him seizures. Patient reportedly used to take Ativan for sleep in the past but not taking any longer. Patient does not see any psychiatric outpatient services or therapeutic services. Patient reported most of the stimulants like caffeine make him sleepy and most of the sleeping medication keep him awake because he has Asperger's.   Patient has been living with his mother and brother who is 22 years older than him. Patient Brother has been suffering with chronic schizophrenia and has been disabled. Patient has been working with the [employer] and has no reported stresses at this time. Patient denied current symptoms of depression, anxiety, suicidal/homicidal ideation, intention or plans. Patient reportedly used to have a bloating out and impulsive behaviors as a child which were much more controlled and able to manage without difficulties at  this time. Patient want to check information regarding medication Remeron which was suggested during this visit for insomnia   Past Psychiatric History: Patient has been diagnosed with Asperger's disorder and the previously admitted to behavioral [facility] last admission was 2006. Patient reportedly not on outpatient medication management or required inpatient hospitalization since then.  Risk to Self: Is patient at risk for suicide?: No, but patient needs Medical Clearance Risk to Others:   Prior Inpatient Therapy:   Prior Outpatient Therapy:    Past Medical History:  Past Medical History  Diagnosis Date  . Aspergers' syndrome   . Insomnia    History reviewed. No pertinent past surgical history. Family History:  Family History  Problem Relation Age of Onset  . Bipolar disorder Mother    Family Psychiatric  History: Family history significant for schizophrenia in his brother   Social History:  History  Alcohol Use  . Yes    Comment: occasional     History  Drug Use  . Yes    Comment: Per triple C, benadryl, duster     Social History   Social History  . Marital Status: Married    Spouse Name: N/A  . Number of Children: N/A  . Years of Education: N/A   Social History Main Topics  . Smoking status: Current Every Day Smoker -- 0.10 packs/day    Types: Cigarettes  . Smokeless tobacco: None  . Alcohol Use: Yes     Comment: occasional  . Drug Use: Yes     Comment: Per triple C, benadryl, duster   . Sexual Activity: Not Asked   Other Topics Concern  . None   Social History Narrative   Additional Social History:Patient has been  happy working as a Engineer, site and eating well.                          Allergies:  No Known Allergies  Labs:  Results for orders placed or performed during the hospital encounter of 12/16/14 (from the past 48 hour(s))  Comprehensive metabolic panel     Status: Abnormal   Collection Time: 12/16/14  8:00 PM  Result Value Ref  Range   Sodium 138 135 - 145 mmol/L   Potassium 3.5 3.5 - 5.1 mmol/L   Chloride 105 101 - 111 mmol/L   CO2 25 22 - 32 mmol/L   Glucose, Bld 106 (H) 65 - 99 mg/dL   BUN 19 6 - 20 mg/dL   Creatinine, Ser 1.20 0.61 - 1.24 mg/dL   Calcium 9.3 8.9 - 10.3 mg/dL   Total Protein 7.5 6.5 - 8.1 g/dL   Albumin 4.8 3.5 - 5.0 g/dL   AST 23 15 - 41 U/L   ALT 15 (L) 17 - 63 U/L   Alkaline Phosphatase 39 38 - 126 U/L   Total Bilirubin 0.5 0.3 - 1.2 mg/dL   GFR calc non Af Amer >60 >60 mL/min   GFR calc Af Amer >60 >60 mL/min    Comment: (NOTE) The eGFR has been calculated using the CKD EPI equation. This calculation has not been validated in all clinical situations. eGFR's persistently <60 mL/min signify possible Chronic Kidney Disease.    Anion gap 8 5 - 15  Ethanol     Status: None   Collection Time: 12/16/14  8:00 PM  Result Value Ref Range   Alcohol, Ethyl (B) <5 <5 mg/dL    Comment:        LOWEST DETECTABLE LIMIT FOR SERUM ALCOHOL IS 5 mg/dL FOR MEDICAL PURPOSES ONLY   Acetaminophen level     Status: Abnormal   Collection Time: 12/16/14  8:00 PM  Result Value Ref Range   Acetaminophen (Tylenol), Serum <10 (L) 10 - 30 ug/mL    Comment:        THERAPEUTIC CONCENTRATIONS VARY SIGNIFICANTLY. A RANGE OF 10-30 ug/mL MAY BE AN EFFECTIVE CONCENTRATION FOR MANY PATIENTS. HOWEVER, SOME ARE BEST TREATED AT CONCENTRATIONS OUTSIDE THIS RANGE. ACETAMINOPHEN CONCENTRATIONS >150 ug/mL AT 4 HOURS AFTER INGESTION AND >50 ug/mL AT 12 HOURS AFTER INGESTION ARE OFTEN ASSOCIATED WITH TOXIC REACTIONS.   Salicylate level     Status: None   Collection Time: 12/16/14  8:00 PM  Result Value Ref Range   Salicylate Lvl <0.2 2.8 - 30.0 mg/dL  CBC with Differential     Status: Abnormal   Collection Time: 12/16/14  8:00 PM  Result Value Ref Range   WBC 13.0 (H) 4.0 - 10.5 K/uL   RBC 4.77 4.22 - 5.81 MIL/uL   Hemoglobin 14.9 13.0 - 17.0 g/dL   HCT 42.2 39.0 - 52.0 %   MCV 88.5 78.0 - 100.0 fL    MCH 31.2 26.0 - 34.0 pg   MCHC 35.3 30.0 - 36.0 g/dL   RDW 12.5 11.5 - 15.5 %   Platelets 280 150 - 400 K/uL   Neutrophils Relative % 76 %   Neutro Abs 10.0 (H) 1.7 - 7.7 K/uL   Lymphocytes Relative 12 %   Lymphs Abs 1.5 0.7 - 4.0 K/uL   Monocytes Relative 9 %   Monocytes Absolute 1.2 (H) 0.1 - 1.0 K/uL   Eosinophils Relative 3 %   Eosinophils Absolute 0.3 0.0 -  0.7 K/uL   Basophils Relative 0 %   Basophils Absolute 0.1 0.0 - 0.1 K/uL  Urine rapid drug screen (hosp performed)     Status: Abnormal   Collection Time: 12/16/14 10:08 PM  Result Value Ref Range   Opiates NONE DETECTED NONE DETECTED   Cocaine NONE DETECTED NONE DETECTED   Benzodiazepines POSITIVE (A) NONE DETECTED   Amphetamines NONE DETECTED NONE DETECTED   Tetrahydrocannabinol NONE DETECTED NONE DETECTED   Barbiturates NONE DETECTED NONE DETECTED    Comment:        DRUG SCREEN FOR MEDICAL PURPOSES ONLY.  IF CONFIRMATION IS NEEDED FOR ANY PURPOSE, NOTIFY LAB WITHIN 5 DAYS.        LOWEST DETECTABLE LIMITS FOR URINE DRUG SCREEN Drug Class       Cutoff (ng/mL) Amphetamine      1000 Barbiturate      200 Benzodiazepine   619 Tricyclics       509 Opiates          300 Cocaine          300 THC              50   Basic metabolic panel     Status: Abnormal   Collection Time: 12/17/14  4:42 AM  Result Value Ref Range   Sodium 139 135 - 145 mmol/L   Potassium 3.2 (L) 3.5 - 5.1 mmol/L   Chloride 105 101 - 111 mmol/L   CO2 25 22 - 32 mmol/L   Glucose, Bld 88 65 - 99 mg/dL   BUN 13 6 - 20 mg/dL   Creatinine, Ser 1.08 0.61 - 1.24 mg/dL   Calcium 8.6 (L) 8.9 - 10.3 mg/dL   GFR calc non Af Amer >60 >60 mL/min   GFR calc Af Amer >60 >60 mL/min    Comment: (NOTE) The eGFR has been calculated using the CKD EPI equation. This calculation has not been validated in all clinical situations. eGFR's persistently <60 mL/min signify possible Chronic Kidney Disease.    Anion gap 9 5 - 15  Magnesium     Status: None    Collection Time: 12/17/14  4:42 AM  Result Value Ref Range   Magnesium 1.8 1.7 - 2.4 mg/dL  Phosphorus     Status: None   Collection Time: 12/17/14  4:42 AM  Result Value Ref Range   Phosphorus 3.7 2.5 - 4.6 mg/dL  CBC     Status: Abnormal   Collection Time: 12/17/14  4:42 AM  Result Value Ref Range   WBC 13.5 (H) 4.0 - 10.5 K/uL   RBC 4.18 (L) 4.22 - 5.81 MIL/uL   Hemoglobin 12.9 (L) 13.0 - 17.0 g/dL   HCT 38.0 (L) 39.0 - 52.0 %   MCV 90.9 78.0 - 100.0 fL   MCH 30.9 26.0 - 34.0 pg   MCHC 33.9 30.0 - 36.0 g/dL   RDW 12.8 11.5 - 15.5 %   Platelets 277 150 - 400 K/uL    Current Facility-Administered Medications  Medication Dose Route Frequency Provider Last Rate Last Dose  . antiseptic oral rinse (CPC / CETYLPYRIDINIUM CHLORIDE 0.05%) solution 7 mL  7 mL Mouth Rinse BID Rondell Charmayne Sheer, MD   7 mL at 12/17/14 1000  . enoxaparin (LOVENOX) injection 40 mg  40 mg Subcutaneous QHS Norval Morton, MD   40 mg at 12/17/14 0005  . LORazepam (ATIVAN) injection 1-2 mg  1-2 mg Intravenous Q4H PRN Norval Morton, MD      .  LORazepam (ATIVAN) tablet 2 mg  2 mg Oral QHS Norval Morton, MD   2 mg at 12/17/14 0005  . sodium chloride 0.9 % injection 3 mL  3 mL Intravenous Q12H Norval Morton, MD   3 mL at 12/17/14 0005    Musculoskeletal: Strength & Muscle Tone: within normal limits Gait & Station: unable to stand Patient leans: N/A  Psychiatric Specialty Exam: ROS denied nausea vomiting, abdominal pain, chest pain and shortness of breath No Fever-chills, No Headache, No changes with Vision or hearing, reports vertigo No problems swallowing food or Liquids, No Chest pain, Cough or Shortness of Breath, No Abdominal pain, No Nausea or Vommitting, Bowel movements are regular, No Blood in stool or Urine, No dysuria, No new skin rashes or bruises, No new joints pains-aches,  No new weakness, tingling, numbness in any extremity, No recent weight gain or loss, No polyuria, polydypsia or  polyphagia,   A full 10 point Review of Systems was done, except as stated above, all other Review of Systems were negative.  Blood pressure 114/66, pulse 76, temperature 98.1 F (36.7 C), temperature source Oral, resp. rate 20, height 6' (1.829 m), weight 76.204 kg (168 lb), SpO2 100 %.Body mass index is 22.78 kg/(m^2).  General Appearance: Casual  Eye Contact::  Good  Speech:  Clear and Coherent  Volume:  Normal  Mood:  Euthymic  Affect:  Appropriate and Congruent  Thought Process:  Coherent and Goal Directed  Orientation:  Full (Time, Place, and Person)  Thought Content:  WDL  Suicidal Thoughts:  No  Homicidal Thoughts:  No  Memory:  Immediate;   Fair Recent;   Fair Remote;   Fair  Judgement:  Intact  Insight:  Fair  Psychomotor Activity:  Decreased  Concentration:  Fair  Recall:  Good  Fund of Knowledge:Good  Language: Good  Akathisia:  Negative  Handed:  Right  AIMS (if indicated):     Assets:  Communication Skills Desire for Improvement Financial Resources/Insurance Housing Leisure Time Physical Health Resilience Social Support Talents/Skills Transportation Vocational/Educational  ADL's:  Intact  Cognition: WNL  Sleep:      Treatment Plan Summary: Daily contact with patient to assess and evaluate symptoms and progress in treatment and Medication management   Case discussed with the staff RN and the staff member at bedside.  Patient has been contracting for safety and has no safety concerns at this time so discontinue safety sitter Recommended Remeron 7.5 mg at bedtime for insomnia and patient would like to look into information before taking it Rescind IVC as patient has been stable psychiatrically and no safety concerns at this time Appreciate psychiatric consultation will sign off at this time Please contact [phone] or [phone] if needs further assistance   Disposition: Patient does not meet criteria for psychiatric inpatient admission. Supportive  therapy provided about ongoing stressors.  Keshawn Sundberg,JANARDHAHA R. 12/17/2014 4:07 PM

## 2014-12-17 NOTE — Progress Notes (Addendum)
Patient ID: Bobby Sullivan, male   DOB: January 24, 1991, 24 y.o.   MRN: [id number] [facility] PROGRESS NOTE  Barnard Sharps EPP:[id number] DOB: 05-07-1990 DOA: 2015/01/10 PCP: No PCP Per Patient will set up with community health wellness on discharge.  Brief narrative:    53 -year-old male with past medical history of Asperger's disorder, history of benadryl overdose. Patient presented to [facility] because of Benadryl overdose. Initial history was obtained by patient's mother who reported patient to apparently sleeping pills that contains Benadryl. Apparently patient has had previous suicidal attempts. Mother reported that he had suicidal ideations prior to the admission. On admission, patient was hemodynamically stable. Alcohol level was within normal limits, Tylenol, salicylate were within normal limits. Urine drug screen was significant for benzodiazepines. He was admitted for observation. Psychiatry will see the patient to evaluate whether he needs safety sitter and if he needs behavioral placement.  Anticipated discharge: If cleared by psychiatry and patient can be discharged today.  Assessment/Plan:    Principal Problem:   Diphenhydramine overdose, intentional / suicidal attempt by overdose / suicidal ideations - Urine drug screen positive for benzodiazepines - Alcohol, Tylenol and salicylates are within normal limits - Mental status good this morning - Patient denies being suicidal this morning - He does have sitter at the bedside - Psych consulted if patient requires behavioral placement  Active Problems:   Asperger's syndrome - Stable   DVT Prophylaxis  - Lovenox subcutaneous ordered   Code Status: Full.  Family Communication:  plan of care discussed with the patient Disposition Plan: Needs evaluation by psychiatry. Sitter at bedside. If psychiatry clears then he can go home today.  IV access:  Peripheral IV  Procedures and diagnostic studies:    Ct Head Wo  Contrast 2015-01-10 No acute intracranial abnormalities. Normal alignment of the cervical spine. No acute displaced fractures identified. Electronically Signed   By: Burman Nieves M.D.   On: January 10, 2015 21:06   Ct Cervical Spine Wo Contrast 01-10-2015  No acute intracranial abnormalities. Normal alignment of the cervical spine. No acute displaced fractures identified. Electronically Signed   By: Burman Nieves M.D.   On: Jan 10, 2015 21:06    Medical Consultants:  Psychiatry  Other Consultants:  None  IAnti-Infectives:   None   Manson Passey, MD  [facility] Pager [phone]  Time spent in minutes: 15 minutes  If 7PM-7AM, please contact night-coverage [email or web address] Password [facility] 12/17/2014, 10:49 AM   LOS: 1 day    HPI/Subjective: No acute overnight events. Patient reports feeling better.  Objective: Filed Vitals:   2015-01-10 2215 Jan 10, 2015 2230 01/10/15 2323 12/17/14 0450  BP: 141/98 138/96 158/112 119/56  Pulse: 115 129 125 67  Temp:   98.8 F (37.1 C) 98.3 F (36.8 C)  TempSrc:   Oral Oral  Resp: Height:   6' (1.829 m)   Weight:   76.204 kg (168 lb)   SpO2: 96% 97% 96% 95%    Intake/Output Summary (Last 24 hours) at 12/17/14 1049 Last data filed at 12/17/14 0700  Gross per 24 hour  Intake 856.25 ml  Output      0 ml  Net 856.25 ml    Exam:   General:  Pt is alert, follows commands appropriately, not in acute distress  Cardiovascular: Regular rate and rhythm, S1/S2, no murmurs  Respiratory: Clear to auscultation bilaterally, no wheezing, no crackles, no rhonchi  Abdomen: Soft, non tender, non distended, bowel sounds present  Extremities: No edema, pulses  DP and PT palpable bilaterally  Neuro: Grossly nonfocal  Data Reviewed: Basic Metabolic Panel:  Recent Labs Lab 12/16/14 2000 12/17/14 0442  NA 138 139  K 3.5 3.2*  CL 105 105  CO2 25 25  GLUCOSE 106* 88  BUN 19 13  CREATININE 1.20 1.08  CALCIUM 9.3 8.6*  MG  --   1.8  PHOS  --  3.7   Liver Function Tests:  Recent Labs Lab 12/16/14 2000  AST 23  ALT 15*  ALKPHOS 39  BILITOT 0.5  PROT 7.5  ALBUMIN 4.8   No results for input(s): LIPASE, AMYLASE in the last 168 hours. No results for input(s): AMMONIA in the last 168 hours. CBC:  Recent Labs Lab 12/16/14 2000 12/17/14 0442  WBC 13.0* 13.5*  NEUTROABS 10.0*  --   HGB 14.9 12.9*  HCT 42.2 38.0*  MCV 88.5 90.9  PLT 280 277   Cardiac Enzymes: No results for input(s): CKTOTAL, CKMB, CKMBINDEX, TROPONINI in the last 168 hours. BNP: Invalid input(s): POCBNP CBG: No results for input(s): GLUCAP in the last 168 hours.  No results found for this or any previous visit (from the past 240 hour(s)).   Scheduled Meds: . antiseptic oral rinse  7 mL Mouth Rinse BID  . enoxaparin (LOVENOX) injection  40 mg Subcutaneous QHS  . LORazepam  2 mg Oral QHS  . sodium chloride  3 mL Intravenous Q12H   Continuous Infusions:

## 2014-12-17 NOTE — Care Management Note (Signed)
Case Management Note  Patient Details  Name: Bobby CaterJoseph Sullivan MRN: [id number] Date of Birth: [birth year]  Subjective/Objective:24 y/o m admitted w/Benadryl OD. From home.Psych cons.1:1.                    Action/Plan:d/c plan home.   Expected Discharge Date:                  Expected Discharge Plan:  Home/Self Care  In-House Referral:     Discharge planning Services  CM Consult  Post Acute Care Choice:    Choice offered to:     DME Arranged:    DME Agency:     HH Arranged:    HH Agency:     Status of Service:  In process, will continue to follow  Medicare Important Message Given:    Date Medicare IM Given:    Medicare IM give by:    Date Additional Medicare IM Given:    Additional Medicare Important Message give by:     If discussed at Long Length of Stay Meetings, dates discussed:    Additional Comments:  Lanier ClamMahabir, Joaquina Nissen, RN 12/17/2014, 10:55 AM

## 2014-12-17 NOTE — Discharge Instructions (Signed)
Lorazepam tablets What is this medicine? LORAZEPAM (lor A ze pam) is a benzodiazepine. It is used to treat anxiety. This medicine may be used for other purposes; ask your health care provider or pharmacist if you have questions. What should I tell my health care provider before I take this medicine? They need to know if you have any of these conditions: -alcohol or drug abuse problem -bipolar disorder, depression, psychosis or other mental health condition -glaucoma -kidney or liver disease -lung disease or breathing difficulties -myasthenia gravis -Parkinson's disease -seizures or a history of seizures -suicidal thoughts -an unusual or allergic reaction to lorazepam, other benzodiazepines, foods, dyes, or preservatives -pregnant or trying to get pregnant -breast-feeding How should I use this medicine? Take this medicine by mouth with a glass of water. Follow the directions on the prescription label. If it upsets your stomach, take it with food or milk. Take your medicine at regular intervals. Do not take it more often than directed. Do not stop taking except on the advice of your doctor or health care professional. Talk to your pediatrician regarding the use of this medicine in children. Special care may be needed. Overdosage: If you think you have taken too much of this medicine contact a poison control center or emergency room at once. NOTE: This medicine is only for you. Do not share this medicine with others. What if I miss a dose? If you miss a dose, take it as soon as you can. If it is almost time for your next dose, take only that dose. Do not take double or extra doses. What may interact with this medicine? -barbiturate medicines for inducing sleep or treating seizures, like phenobarbital -clozapine -medicines for depression, mental problems or psychiatric disturbances -medicines for sleep -phenytoin -probenecid -theophylline -valproic acid This list may not describe all  possible interactions. Give your health care provider a list of all the medicines, herbs, non-prescription drugs, or dietary supplements you use. Also tell them if you smoke, drink alcohol, or use illegal drugs. Some items may interact with your medicine. What should I watch for while using this medicine? Visit your doctor or health care professional for regular checks on your progress. Your body may become dependent on this medicine, ask your doctor or health care professional if you still need to take it. However, if you have been taking this medicine regularly for some time, do not suddenly stop taking it. You must gradually reduce the dose or you may get severe side effects. Ask your doctor or health care professional for advice before increasing or decreasing the dose. Even after you stop taking this medicine it can still affect your body for several days. You may get drowsy or dizzy. Do not drive, use machinery, or do anything that needs mental alertness until you know how this medicine affects you. To reduce the risk of dizzy and fainting spells, do not stand or sit up quickly, especially if you are an older patient. Alcohol may increase dizziness and drowsiness. Avoid alcoholic drinks. Do not treat yourself for coughs, colds or allergies without asking your doctor or health care professional for advice. Some ingredients can increase possible side effects. What side effects may I notice from receiving this medicine? Side effects that you should report to your doctor or health care professional as soon as possible: -changes in vision -confusion -depression -mood changes, excitability or aggressive behavior -movement difficulty, staggering or jerky movements -muscle cramps -restlessness -weakness or tiredness Side effects that usually do not require   medical attention (report to your doctor or health care professional if they continue or are bothersome): -constipation or diarrhea -difficulty  sleeping, nightmares -dizziness, drowsiness -headache -nausea, vomiting This list may not describe all possible side effects. Call your doctor for medical advice about side effects. You may report side effects to FDA at 1-800-FDA-1088. Where should I keep my medicine? Keep out of the reach of children. This medicine can be abused. Keep your medicine in a safe place to protect it from theft. Do not share this medicine with anyone. Selling or giving away this medicine is dangerous and against the law. This medicine may cause accidental overdose and death if taken by other adults, children, or pets. Mix any unused medicine with a substance like cat litter or coffee grounds. Then throw the medicine away in a sealed container like a sealed bag or a coffee can with a lid. Do not use the medicine after the expiration date. Store at room temperature between 20 and 25 degrees C (68 and 77 degrees F). Protect from light. Keep container tightly closed. NOTE: This sheet is a summary. It may not cover all possible information. If you have questions about this medicine, talk to your doctor, pharmacist, or health care provider.    2016, Elsevier/Gold Standard. (2013-10-02 15:24:21)  

## 2014-12-17 NOTE — Discharge Summary (Signed)
Physician Discharge Summary  Bobby Sullivan ZOX:[id number] DOB: August 20, 1990 DOA: 12/16/2014  PCP: No PCP Per Patient  Admit date: 12/16/2014 Discharge date: 12/17/2014  Recommendations for Outpatient Follow-up:  1. No changes in medications of discharge. 2. Follow up with PCP in 1 week to make sure symptoms are stable.  Discharge Diagnoses:  Principal Problem:   Diphenhydramine overdose Active Problems:   Asperger's syndrome   Insomnia, uncontrolled   Leukocytosis   Sinus tachycardia (HCC)   Prolonged Q-T interval on ECG    Discharge Condition: stable   Diet recommendation: as tolerated   History of present illness:  24 -year-old male with past medical history of Asperger's disorder, history of benadryl overdose. Patient presented to [facility] because of Benadryl overdose. Initial history was obtained by patient's mother who reported patient to apparently sleeping pills that contains Benadryl. Apparently patient has had previous suicidal attempts. Mother reported that he had suicidal ideations prior to the admission. On admission, patient was hemodynamically stable. Alcohol level was within normal limits, Tylenol, salicylate were within normal limits. Urine drug screen was significant for benzodiazepines. He was admitted for observation. Psychiatry determined pt stable for discharge home and does not need inpatient treatment.   Hospital Course:   Assessment/Plan:    Principal Problem:  Diphenhydramine overdose, intentional / suicidal attempt by overdose / suicidal ideations - Urine drug screen positive for benzodiazepines - Alcohol, Tylenol and salicylates are within normal limits - Mental status good - Patient denies being suicidal this morning - Psych consulted and ok from their standpoint to go home   Active Problems:  Asperger's syndrome - Stable  DVT Prophylaxis  - Lovenox subcutaneous  Code Status: Full.  Family Communication: plan of care  discussed with the patient   IV access:  Peripheral IV  Procedures and diagnostic studies:   Ct Head Wo Contrast 12/16/2014 No acute intracranial abnormalities. Normal alignment of the cervical spine. No acute displaced fractures identified. Electronically Signed By: Burman Nieves M.D. On: 12/16/2014 21:06   Ct Cervical Spine Wo Contrast 12/16/2014 No acute intracranial abnormalities. Normal alignment of the cervical spine. No acute displaced fractures identified. Electronically Signed By: Burman Nieves M.D. On: 12/16/2014 21:06    Medical Consultants:  Psychiatry  Other Consultants:  None  IAnti-Infectives:   None   Signed:  Manson Passey, MD  [facility] 12/17/2014, 5:14 PM  Pager #: [phone]  Time spent in minutes: less than 30 minutes    Discharge Exam: Filed Vitals:   12/17/14 0450 12/17/14 1424  BP: 119/56 114/66  Pulse: 67 76  Temp: 98.3 F (36.8 C) 98.1 F (36.7 C)  Resp: 16 20   Filed Vitals:   12/16/14 2230 12/16/14 2323 12/17/14 0450 12/17/14 1424  BP: 138/96 158/112 119/56 114/66  Pulse: 129 125 67 76  Temp:  98.8 F (37.1 C) 98.3 F (36.8 C) 98.1 F (36.7 C)  TempSrc:  Oral Oral Oral  Resp: Height:  6' (1.829 m)    Weight:  76.204 kg (168 lb)    SpO2: 97% 96% 95% 100%    General: Pt is alert, follows commands appropriately, not in acute distress Cardiovascular: Regular rate and rhythm, S1/S2 +, no murmurs Respiratory: Clear to auscultation bilaterally, no wheezing, no crackles, no rhonchi Abdominal: Soft, non tender, non distended, bowel sounds +, no guarding Extremities: no edema, no cyanosis, pulses palpable bilaterally DP and PT Neuro: Grossly nonfocal  Discharge Instructions  Discharge Instructions    Call MD for:  difficulty breathing, headache or visual disturbances    Complete by:  As directed      Call MD for:  persistant nausea and vomiting    Complete by:  As directed       Call MD for:  severe uncontrolled pain    Complete by:  As directed      Diet - low sodium heart healthy    Complete by:  As directed      Increase activity slowly    Complete by:  As directed             Medication List    STOP taking these medications        LORazepam 2 MG tablet  Commonly known as:  ATIVAN          The results of significant diagnostics from this hospitalization (including imaging, microbiology, ancillary and laboratory) are listed below for reference.    Significant Diagnostic Studies: Ct Head Wo Contrast  12/16/2014  CLINICAL DATA:  Patient fell twice in the kitchen striking head on the dishwasher. Possible seizure prior to fall. Possible overdose of Benadryl. EXAM: CT HEAD WITHOUT CONTRAST CT CERVICAL SPINE WITHOUT CONTRAST TECHNIQUE: Multidetector CT imaging of the head and cervical spine was performed following the standard protocol without intravenous contrast. Multiplanar CT image reconstructions of the cervical spine were also generated. COMPARISON:  CT head 12/17/2013 FINDINGS: CT HEAD FINDINGS Ventricles and sulci appear symmetrical. No ventricular dilatation. No mass effect or midline shift. No abnormal extra-axial fluid collections. Gray-white matter junctions are distinct. Basal cisterns are not effaced. No evidence of acute intracranial hemorrhage. No depressed skull fractures. Visualized paranasal sinuses and mastoid air cells are not opacified. CT CERVICAL SPINE FINDINGS Normal alignment of the cervical spine. Intervertebral disc space heights are preserved. C1-2 articulation appears intact. No vertebral compression deformities. No prevertebral soft tissue swelling. No focal bone lesion or bone destruction. Soft tissues are unremarkable. IMPRESSION: No acute intracranial abnormalities. Normal alignment of the cervical spine. No acute displaced fractures identified. Electronically Signed   By: Burman Nieves M.D.   On: 12/16/2014 21:06   Ct  Cervical Spine Wo Contrast  12/16/2014  CLINICAL DATA:  Patient fell twice in the kitchen striking head on the dishwasher. Possible seizure prior to fall. Possible overdose of Benadryl. EXAM: CT HEAD WITHOUT CONTRAST CT CERVICAL SPINE WITHOUT CONTRAST TECHNIQUE: Multidetector CT imaging of the head and cervical spine was performed following the standard protocol without intravenous contrast. Multiplanar CT image reconstructions of the cervical spine were also generated. COMPARISON:  CT head 12/17/2013 FINDINGS: CT HEAD FINDINGS Ventricles and sulci appear symmetrical. No ventricular dilatation. No mass effect or midline shift. No abnormal extra-axial fluid collections. Gray-white matter junctions are distinct. Basal cisterns are not effaced. No evidence of acute intracranial hemorrhage. No depressed skull fractures. Visualized paranasal sinuses and mastoid air cells are not opacified. CT CERVICAL SPINE FINDINGS Normal alignment of the cervical spine. Intervertebral disc space heights are preserved. C1-2 articulation appears intact. No vertebral compression deformities. No prevertebral soft tissue swelling. No focal bone lesion or bone destruction. Soft tissues are unremarkable. IMPRESSION: No acute intracranial abnormalities. Normal alignment of the cervical spine. No acute displaced fractures identified. Electronically Signed   By: Burman Nieves M.D.   On: 12/16/2014 21:06    Microbiology: No results found for this or any previous visit (from the past 240 hour(s)).   Labs: Basic Metabolic Panel:  Recent Labs Lab 12/16/14 2000 12/17/14 0442  NA 138 139  K 3.5 3.2*  CL 105 105  CO2 25 25  GLUCOSE 106* 88  BUN 19 13  CREATININE 1.20 1.08  CALCIUM 9.3 8.6*  MG  --  1.8  PHOS  --  3.7   Liver Function Tests:  Recent Labs Lab 12/16/14 2000  AST 23  ALT 15*  ALKPHOS 39  BILITOT 0.5  PROT 7.5  ALBUMIN 4.8   No results for input(s): LIPASE, AMYLASE in the last 168 hours. No results  for input(s): AMMONIA in the last 168 hours. CBC:  Recent Labs Lab 12/16/14 2000 12/17/14 0442  WBC 13.0* 13.5*  NEUTROABS 10.0*  --   HGB 14.9 12.9*  HCT 42.2 38.0*  MCV 88.5 90.9  PLT 280 277   Cardiac Enzymes: No results for input(s): CKTOTAL, CKMB, CKMBINDEX, TROPONINI in the last 168 hours. BNP: BNP (last 3 results) No results for input(s): BNP in the last 8760 hours.  ProBNP (last 3 results) No results for input(s): PROBNP in the last 8760 hours.  CBG: No results for input(s): GLUCAP in the last 168 hours.

## 2015-03-29 ENCOUNTER — Inpatient Hospital Stay (HOSPITAL_COMMUNITY)
Admission: EM | Admit: 2015-03-29 | Discharge: 2015-03-30 | DRG: 918 | Disposition: A | Discharge status: Home / Self Care | Payer: No Typology Code available for payment source | Attending: Family Medicine | Admitting: Family Medicine

## 2015-03-29 ENCOUNTER — Encounter (HOSPITAL_COMMUNITY): Payer: Self-pay | Admitting: Nurse Practitioner

## 2015-03-29 DIAGNOSIS — T450X4A Poisoning by antiallergic and antiemetic drugs, undetermined, initial encounter: Principal | ICD-10-CM | POA: Diagnosis present

## 2015-03-29 DIAGNOSIS — Z818 Family history of other mental and behavioral disorders: Secondary | ICD-10-CM

## 2015-03-29 DIAGNOSIS — F909 Attention-deficit hyperactivity disorder, unspecified type: Secondary | ICD-10-CM | POA: Diagnosis present

## 2015-03-29 DIAGNOSIS — F845 Asperger's syndrome: Secondary | ICD-10-CM | POA: Diagnosis present

## 2015-03-29 DIAGNOSIS — Z915 Personal history of self-harm: Secondary | ICD-10-CM

## 2015-03-29 DIAGNOSIS — R000 Tachycardia, unspecified: Secondary | ICD-10-CM | POA: Diagnosis present

## 2015-03-29 DIAGNOSIS — F4320 Adjustment disorder, unspecified: Secondary | ICD-10-CM | POA: Diagnosis present

## 2015-03-29 DIAGNOSIS — G4700 Insomnia, unspecified: Secondary | ICD-10-CM | POA: Diagnosis present

## 2015-03-29 DIAGNOSIS — G40909 Epilepsy, unspecified, not intractable, without status epilepticus: Secondary | ICD-10-CM

## 2015-03-29 DIAGNOSIS — F4329 Adjustment disorder with other symptoms: Secondary | ICD-10-CM | POA: Diagnosis present

## 2015-03-29 DIAGNOSIS — R0682 Tachypnea, not elsewhere classified: Secondary | ICD-10-CM | POA: Diagnosis present

## 2015-03-29 DIAGNOSIS — F17210 Nicotine dependence, cigarettes, uncomplicated: Secondary | ICD-10-CM | POA: Diagnosis present

## 2015-03-29 HISTORY — DX: Anxiety disorder, unspecified: F41.9

## 2015-03-29 LAB — COMPREHENSIVE METABOLIC PANEL
ALT: 32 U/L (ref 17–63)
AST: 34 U/L (ref 15–41)
Albumin: 5.2 g/dL — ABNORMAL HIGH (ref 3.5–5.0)
Alkaline Phosphatase: 41 U/L (ref 38–126)
Anion gap: 11 (ref 5–15)
BUN: 14 mg/dL (ref 6–20)
CHLORIDE: 104 mmol/L (ref 101–111)
CO2: 25 mmol/L (ref 22–32)
CREATININE: 1.09 mg/dL (ref 0.61–1.24)
Calcium: 9.3 mg/dL (ref 8.9–10.3)
GFR calc Af Amer: >60 mL/min (ref >60)
GFR calc non Af Amer: >60 mL/min (ref >60)
Glucose, Bld: 122 mg/dL — ABNORMAL HIGH (ref 65–99)
POTASSIUM: 3.5 mmol/L (ref 3.5–5.1)
SODIUM: 140 mmol/L (ref 135–145)
Total Bilirubin: 0.5 mg/dL (ref 0.3–1.2)
Total Protein: 7.6 g/dL (ref 6.5–8.1)

## 2015-03-29 LAB — CBC WITH DIFFERENTIAL/PLATELET
Basophils Absolute: 0.1 10*3/uL (ref 0.0–0.1)
Basophils Relative: 1 %
EOS PCT: 4 %
Eosinophils Absolute: 0.3 10*3/uL (ref 0.0–0.7)
HEMATOCRIT: 41.5 % (ref 39.0–52.0)
Hemoglobin: 14.7 g/dL (ref 13.0–17.0)
LYMPHS ABS: 1.9 10*3/uL (ref 0.7–4.0)
LYMPHS PCT: 25 %
MCH: 31.3 pg (ref 26.0–34.0)
MCHC: 35.4 g/dL (ref 30.0–36.0)
MCV: 88.3 fL (ref 78.0–100.0)
MONO ABS: 1 10*3/uL (ref 0.1–1.0)
Monocytes Relative: 14 %
NEUTROS ABS: 4.1 10*3/uL (ref 1.7–7.7)
Neutrophils Relative %: 56 %
PLATELETS: 240 10*3/uL (ref 150–400)
RBC: 4.7 MIL/uL (ref 4.22–5.81)
RDW: 12.5 % (ref 11.5–15.5)
WBC: 7.3 10*3/uL (ref 4.0–10.5)

## 2015-03-29 LAB — ACETAMINOPHEN LEVEL
Acetaminophen (Tylenol), Serum: <10 ug/mL — ABNORMAL LOW (ref 10–30)
Acetaminophen (Tylenol), Serum: <10 ug/mL — ABNORMAL LOW (ref 10–30)

## 2015-03-29 LAB — ETHANOL: Alcohol, Ethyl (B): <5 mg/dL (ref <5)

## 2015-03-29 LAB — CK: Total CK: 248 U/L (ref 49–397)

## 2015-03-29 LAB — SALICYLATE LEVEL: Salicylate Lvl: <4 mg/dL (ref 2.8–30.0)

## 2015-03-29 MED ORDER — SODIUM CHLORIDE 0.9% FLUSH
3.0000 mL | Freq: Two times a day (BID) | INTRAVENOUS | Status: DC
  Administered 2015-03-29: 3 mL via INTRAVENOUS

## 2015-03-29 MED ORDER — POTASSIUM CHLORIDE IN NACL 20-0.9 MEQ/L-% IV SOLN
INTRAVENOUS | Status: DC
  Administered 2015-03-29: 100 mL via INTRAVENOUS
  Administered 2015-03-29: 22:00:00 via INTRAVENOUS
  Administered 2015-03-30 (×2): 100 mL via INTRAVENOUS
  Filled 2015-03-29 (×6): qty 1000

## 2015-03-29 MED ORDER — LORAZEPAM 2 MG/ML IJ SOLN
2.0000 mg | Freq: Once | INTRAMUSCULAR | Status: AC
  Administered 2015-03-29: 2 mg via INTRAVENOUS
  Filled 2015-03-29: qty 1

## 2015-03-29 MED ORDER — DIAZEPAM 5 MG/ML IJ SOLN
5.0000 mg | Freq: Once | INTRAMUSCULAR | Status: AC
  Administered 2015-03-29: 5 mg via INTRAVENOUS
  Filled 2015-03-29: qty 2

## 2015-03-29 MED ORDER — SODIUM CHLORIDE 0.9 % IV BOLUS (SEPSIS)
1000.0000 mL | Freq: Once | INTRAVENOUS | Status: AC
  Administered 2015-03-29: 1000 mL via INTRAVENOUS

## 2015-03-29 MED ORDER — POTASSIUM CHLORIDE IN NACL 20-0.9 MEQ/L-% IV SOLN
INTRAVENOUS | Status: DC
  Administered 2015-03-29: 09:00:00 via INTRAVENOUS
  Filled 2015-03-29 (×2): qty 1000

## 2015-03-29 MED ORDER — SODIUM CHLORIDE 0.9 % IV SOLN: INTRAVENOUS | Status: DC

## 2015-03-29 MED ORDER — DIAZEPAM 5 MG/ML IJ SOLN: 5.0000 mg | INTRAMUSCULAR | Status: DC | PRN

## 2015-03-29 MED ORDER — ENOXAPARIN SODIUM 40 MG/0.4ML ~~LOC~~ SOLN
40.0000 mg | SUBCUTANEOUS | Status: DC
  Administered 2015-03-29 – 2015-03-30 (×2): 40 mg via SUBCUTANEOUS
  Filled 2015-03-29 (×3): qty 0.4

## 2015-03-29 NOTE — ED Notes (Signed)
Pt repeatedly crawling out of bed after redirection to lay down and stay in bed; pt removed leads from chest; leads reattached by sitter; pt removed leads again

## 2015-03-29 NOTE — H&P (Signed)
[facility] History and Physical  Bobby Sullivan VHQ:[id number] DOB: Feb 09, 1990 DOA: 03/29/2015  Referring physician: ED physician PCP: No PCP Per Patient  Specialists: None listed  Chief Complaint:  Overdose   HPI: Bobby Sullivan is a 25 y.o. male with PMH of Asperger syndrome, insomnia, drug-induced seizures, multiple intentional diphenhydramine overdoses, and tobacco abuse who is brought into the ED following an apparent overdose involving diphenhydramine. This is the patient's third admission for intentional diphenhydramine overdose in the past 7 months. Per report, patient took a full bottle (32 count) of 50 mg diphenhydramine tablets. Further details regarding the history are unfortunately very scarce at this time the patient is unable to contribute. His mother is currently unreachable by phone. EMS was activated, apparently after the patient had been exhibiting bizarre behavior and an empty bottle was discovered.   In ED, patient was found to be afebrile, saturating well on room air, tachycardic to 130, tachypneic to 39, and with blood pressure stable. CBC and CMP are obtained and essentially normal. Ethanol level is less than 5, Tylenol level less than 10, and salicylate level less than 4. EKG features sinus tachycardia with a rate of 140 and suggestion of possible RVH with a secondary repolarization abnormality. Patient was bolused with 2 L of normal saline, 2 mg IV push of Ativan was given for sedation, and the patient was monitored on telemetry. Patient remains agitated and tachycardic and will be admitted for ongoing evaluation and management of suspected diphenhydramine overdose.  Where does patient live?   At home   Can patient participate in ADLs?  Yes        Review of Systems:  Unable to obtain ROS secondary to patient's clinical condition with confusion and disorientation due to drug overdose.     Allergy: No Known Allergies  Past Medical History  Diagnosis Date  .  Aspergers' syndrome   . Insomnia     History reviewed. No pertinent past surgical history.  Social History:  reports that he has been smoking Cigarettes.  He has been smoking about 0.10 packs per day. He does not have any smokeless tobacco history on file. He reports that he drinks alcohol. He reports that he uses illicit drugs.  Family History:  Family History  Problem Relation Age of Onset  . Bipolar disorder Mother      Prior to Admission medications   Not on File    Physical Exam: Filed Vitals:   03/29/15 0230 03/29/15 0249 03/29/15 0256 03/29/15 0502  BP: 115/89  148/82 115/68  Pulse:      Temp:      TempSrc:      Resp: 26 39    SpO2:       General: Not in acute distress HEENT:       Eyes: Pupils equal dilated and sluggishly reactive, no scleral icterus or conjunctival pallor.       ENT: No discharge from the ears or nose, no pharyngeal ulcers, oral mucosa dry.        Neck: No JVD, no bruit, no appreciable mass Heme: No cervical adenopathy, no pallor Cardiac: Rate ~120 and regular with hyperdynamic precordium, No murmurs, No gallops or rubs. Pulm: Good air movement bilaterally. No rales, wheezing, rhonchi or rubs. Abd: Soft, nondistended, nontender, no rebound pain or gaurding, no mass or organomegaly, BS present. Ext: No LE edema bilaterally. 2+DP/PT pulse bilaterally. Musculoskeletal: No gross deformity, no red, hot, swollen joints   Skin: No rashes or wounds on exposed surfaces  Neuro: Agitated, disoriented, eyes open but not making eye contact or acknowledging examiner's presence. Horizontal nystagmus right eye noted. Patellar DTR 2+b/l. Moving all extremities, no facial asymmetry, normal muscle tone throughout, Babinski down-going b/l.  Psych: Agitated, disoriented, attending to internal stimuli.   Labs on Admission:  Basic Metabolic Panel:  Recent Labs Lab 03/29/15 0156  NA 140  K 3.5  CL 104  CO2 25  GLUCOSE 122*  BUN 14  CREATININE 1.09  CALCIUM  9.3   Liver Function Tests:  Recent Labs Lab 03/29/15 0156  AST 34  ALT 32  ALKPHOS 41  BILITOT 0.5  PROT 7.6  ALBUMIN 5.2*   No results for input(s): LIPASE, AMYLASE in the last 168 hours. No results for input(s): AMMONIA in the last 168 hours. CBC:  Recent Labs Lab 03/29/15 0156  WBC 7.3  NEUTROABS 4.1  HGB 14.7  HCT 41.5  MCV 88.3  PLT 240   Cardiac Enzymes: No results for input(s): CKTOTAL, CKMB, CKMBINDEX, TROPONINI in the last 168 hours.  BNP (last 3 results) No results for input(s): BNP in the last 8760 hours.  ProBNP (last 3 results) No results for input(s): PROBNP in the last 8760 hours.  CBG: No results for input(s): GLUCAP in the last 168 hours.  Radiological Exams on Admission: No results found.  EKG: Independently reviewed.  Abnormal findings:  Sinus tachycardia (rate 140), possible RVH with secondary repolarization abnormality   Assessment/Plan  1. Diphenhydramine overdose  - Reportedly took 32 tabs of 50 mg diphenhydramine prior to arrival  - This is 3rd admission in last 7 mos for the same - Case discussed with poison control with recommendations as follows:  - Monitor on telemetry for QT and QRS abnormalities  - Keep sedated, diazepam 1st line; use Precedex if diazepam not effective  - APAP, salicylates, EtOH all below limits of detection  - UDS ordered and pending  - Check CK if agitation persists, ordered and pending - Keep sitter at bedside, suicide precautions   2. Seizure disorder  - History of drug-induced seizures per report  - Seizure precautions     DVT ppx: SQ Heparin  SQ Lovenox   SCDs  Code Status: Full code Family Communication: None at bed side.               Disposition Plan: Admit to inpatient   Date of Service 03/29/2015    Briscoe Deutscherimothy S Opyd, MD [facility] Pager [phone]  If 7PM-7AM, please contact night-coverage [email or web address] Password [email or web address] 03/29/2015, 5:55 AM

## 2015-03-29 NOTE — ED Notes (Signed)
Bobby LoserRhonda mother called suspecting pt has circadian rhythm disorder, reports son has taken several different medications to correct inability to sleep without success, hospitalist aware and at bedside

## 2015-03-29 NOTE — ED Notes (Signed)
Pt urinated on himself soaking sheets

## 2015-03-29 NOTE — ED Provider Notes (Signed)
CSN: [id number]     Arrival date & time 03/29/15  0122 History   By signing my name below, I, Bobby Sullivan, attest that this documentation has been prepared under the direction and in the presence of Bobby Albe, MD at [phone].  Electronically Signed: Arlan Sullivan, ED Scribe. 03/29/2015. 1:54 AM.   No chief complaint on file.  The history is provided by the patient. No language interpreter was used.    LEVEL 5 CAVEAT DUE TO ACUITY OF CONDITION   HPI Comments: Bobby Sullivan is a 25 y.o. male with a PMHx of Aspergers' syndrome who presents to the Emergency Department here after an overdose this evening. Intent unknown. Per triage note, pt took 32 tablets of Diphenhydramine.   PCP: No PCP Per Patient    Past Medical History  Diagnosis Date  . Aspergers' syndrome   . Insomnia    History reviewed. No pertinent past surgical history. Family History  Problem Relation Age of Onset  . Bipolar disorder Mother    Social History  Substance Use Topics  . Smoking status: Current Every Day Smoker -- 0.10 packs/day    Types: Cigarettes  . Smokeless tobacco: None  . Alcohol Use: Yes     Comment: occasional    Review of Systems  Unable to perform ROS: Acuity of condition      Allergies  Review of patient's allergies indicates no known allergies.  Home Medications   Prior to Admission medications   Not on File   Triage Vitals: BP 115/68 mmHg  Pulse 141  Temp(Src) 97.8 F (36.6 C) (Oral)  Resp 39  SpO2 99%  Vital signs normal except for tachycardia    Physical Exam  Constitutional: He appears well-developed and well-nourished.  HENT:  Head: Normocephalic and atraumatic.  Right Ear: External ear normal.  Left Ear: External ear normal.  Nose: Nose normal.  Mouth/Throat: Mucous membranes are normal.  Dry mucous membranes   Eyes: Conjunctivae are normal.  Pupils are dilated Staring as if seeing things  Neck: Neck supple.  Cardiovascular: Regular rhythm and normal heart  sounds.  Tachycardia present.  Exam reveals no gallop and no friction rub.   No murmur heard. Pulmonary/Chest: Effort normal and breath sounds normal. No respiratory distress. He has no wheezes. He has no rhonchi. He has no rales. He exhibits no tenderness and no crepitus.  Abdominal: Soft. Normal appearance and bowel sounds are normal. He exhibits no distension and no mass. There is no tenderness. There is no rebound and no guarding.  No obvious mass c/w bladder felt  Musculoskeletal: Normal range of motion. He exhibits no edema or tenderness.  Moves all extremities well.   Neurological:  Trouble speaking  Diffucult following commands  Skin: Skin is warm, dry and intact. No rash noted. No erythema. No pallor.  Psychiatric: His mood appears not anxious.  Nursing note and vitals reviewed.   ED Course  Procedures (including critical care time)  Medications  sodium chloride 0.9 % bolus 1,000 mL (not administered)  0.9 %  sodium chloride infusion (not administered)  diazepam (VALIUM) injection 5 mg (not administered)  sodium chloride 0.9 % bolus 1,000 mL (1,000 mLs Intravenous New Bag/Given 03/29/15 0207)  LORazepam (ATIVAN) injection 2 mg (2 mg Intravenous Given 03/29/15 0207)  LORazepam (ATIVAN) injection 2 mg (2 mg Intravenous Given 03/29/15 0327)  LORazepam (ATIVAN) injection 2 mg (2 mg Intravenous Given 03/29/15 0340)  LORazepam (ATIVAN) injection 2 mg (2 mg Intravenous Given 03/29/15 0503)  DIAGNOSTIC STUDIES: Oxygen Saturation is 99% on RA, Normal by my interpretation.    COORDINATION OF CARE: 1:46 AM- Will give fluids. Will order CMP, Ethanol, CBC, Acetaminophen level, salicylate level, and urine rapid drug screen, and EKG.      Patient remains agitated, he was given Ativan 2 mg IV at 2:15 AM.  At 3:12 AM patient still trying to get out of bed,still appears to be hallucinating. He was given Ativan 2 mg IV  3:45 AM when I walked by the room patient is still agitated, he sitting up  in bed holding his arms and odd positions. He was given an additional 2 mg Ativan.  4:59 AM patient trying to get out of bed, he was given more Ativan 2 mg IV  Poison control had recommended a 4 hour EKG and acetaminophen level. These were done. They called back and say we could try Valium at the Ativan is not controlling his agitation. Patient was given Valium 5 mg IV.  05:29 Dr Antionette Charpyd, hospitalist, admit to tele with a sitter.    Labs Review Results for orders placed or performed during the hospital encounter of 03/29/15  Comprehensive metabolic panel  Result Value Ref Range   Sodium 140 135 - 145 mmol/L   Potassium 3.5 3.5 - 5.1 mmol/L   Chloride 104 101 - 111 mmol/L   CO2 25 22 - 32 mmol/L   Glucose, Bld 122 (H) 65 - 99 mg/dL   BUN 14 6 - 20 mg/dL   Creatinine, Ser 1.611.09 0.61 - 1.24 mg/dL   Calcium 9.3 8.9 - 09.610.3 mg/dL   Total Protein 7.6 6.5 - 8.1 g/dL   Albumin 5.2 (H) 3.5 - 5.0 g/dL   AST 34 15 - 41 U/L   ALT 32 17 - 63 U/L   Alkaline Phosphatase 41 38 - 126 U/L   Total Bilirubin 0.5 0.3 - 1.2 mg/dL   GFR calc non Af Amer >60 >60 mL/min   GFR calc Af Amer >60 >60 mL/min   Anion gap 11 5 - 15  Ethanol  Result Value Ref Range   Alcohol, Ethyl (B) <5 <5 mg/dL  CBC with Differential  Result Value Ref Range   WBC 7.3 4.0 - 10.5 K/uL   RBC 4.70 4.22 - 5.81 MIL/uL   Hemoglobin 14.7 13.0 - 17.0 g/dL   HCT 04.541.5 40.939.0 - 81.152.0 %   MCV 88.3 78.0 - 100.0 fL   MCH 31.3 26.0 - 34.0 pg   MCHC 35.4 30.0 - 36.0 g/dL   RDW 91.412.5 78.211.5 - 95.615.5 %   Platelets 240 150 - 400 K/uL   Neutrophils Relative % 56 %   Neutro Abs 4.1 1.7 - 7.7 K/uL   Lymphocytes Relative 25 %   Lymphs Abs 1.9 0.7 - 4.0 K/uL   Monocytes Relative 14 %   Monocytes Absolute 1.0 0.1 - 1.0 K/uL   Eosinophils Relative 4 %   Eosinophils Absolute 0.3 0.0 - 0.7 K/uL   Basophils Relative 1 %   Basophils Absolute 0.1 0.0 - 0.1 K/uL  Acetaminophen level  Result Value Ref Range   Acetaminophen (Tylenol), Serum <10 (L) 10 -  30 ug/mL  Salicylate level  Result Value Ref Range   Salicylate Lvl <4.0 2.8 - 30.0 mg/dL    Laboratory interpretation all normal except hyperglycemia    Imaging Review No results found. I have personally reviewed and evaluated these images and lab results as part of my medical decision-making.   EKG Interpretation  Date/Time:  Saturday March 29 2015 01:31:08 EST Ventricular Rate:  140 PR Interval:  173 QRS Duration: 107 QT Interval:  227 QTC Calculation: 346 R Axis:   73 Text Interpretation:  Sinus tachycardia Consider right atrial enlargement  Consider RVH w/ secondary repol abnormality Nonspecific repol abnormality,  lateral leads Since last tracing rate faster 17 Dec 2014 Confirmed by  [facility]  MD-I, Tayte Mcwherter ([id number]) on 03/29/2015 2:21:04 AM      EKG Interpretation  Date/Time:  Saturday March 29 2015 05:06:58 EST Ventricular Rate:  99 PR Interval:  146 QRS Duration: 99 QT Interval:  367 QTC Calculation: 471 R Axis:   74 Text Interpretation:  Sinus rhythm Borderline T wave abnormalities Borderline prolonged QT interval Since last tracing rate slower from 01:31 earlier today Confirmed by Jaree Dwight  MD-I, Macarthur Lorusso ([id number]) on 03/29/2015 5:51:21 AM         MDM   Final diagnoses:  Diphenhydramine overdose of undetermined intent, initial encounter     Plan admission  Bobby Albe, MD, FACEP   CRITICAL CARE Performed by: Bobby Sullivan L Total critical care time: 31 minutes Critical care time was exclusive of separately billable procedures and treating other patients. Critical care was necessary to treat or prevent imminent or life-threatening deterioration. Critical care was time spent personally by me on the following activities: development of treatment plan with patient and/or surrogate as well as nursing, discussions with consultants, evaluation of patient's response to treatment, examination of patient, obtaining history from patient or surrogate, ordering and performing  treatments and interventions, ordering and review of laboratory studies, ordering and review of radiographic studies, pulse oximetry and re-evaluation of patient's condition.   Bobby Albe, MD 03/29/15 [phone]

## 2015-03-29 NOTE — ED Notes (Signed)
PT CAN GO TO FLOOR AT 1313

## 2015-03-29 NOTE — ED Notes (Addendum)
[facility] states that if Ativan is not effective, Diazepam should be tried. If Diazepam is not effective, use Precedex.  Poison Control also recommends more fluids.

## 2015-03-29 NOTE — Progress Notes (Signed)
  [facility] PROGRESS NOTE   Bobby CaterJoseph Sullivan ZOX:[id number] DOB: [birth year] DOA: 03/29/2015 PCP: No PCP Per Patient  HPI/Subjective: Spoke with his mother "She has bipolar disorder, and his brother has schizophrenia"  Assessment/Plan: Principal Problem:   Diphenhydramine overdose of undetermined intent Active Problems:   Adjustment disorder with emotional disturbance   Asperger's syndrome   Sinus tachycardia (HCC)   This is a no charge note, patient seen and examined by my colleague Dr. Bluford Kaufmannoh. Today. Presented with what appears to be diphenhydramine overdose. While awake and alert today, will obtain psychiatric consultation. His mother mentioned that he might have circadian rhythm disorder but in the same time she told me he drinks 2-3 bottles of Redbull after he comes from work and he wants, he he watches TV all night. UDS pending.  Code Status: Full Code Family Communication: Plan discussed with the patient. Disposition Plan: Remains inpatient Diet: Diet NPO time specified  Consultants:  None  Procedures:  None  Antibiotics:  None (indicate start date, and stop date if known)   Objective: Filed Vitals:   03/29/15 1204 03/29/15 1231  BP: 116/71 123/69  Pulse: 100 100  Temp:    Resp: 22 22   No intake or output data in the 24 hours ending 03/29/15 1256 There were no vitals filed for this visit.  Exam: General: Alert and awake, oriented x3, not in any acute distress. HEENT: anicteric sclera, pupils reactive to light and accommodation, EOMI CVS: S1-S2 clear, no murmur rubs or gallops Chest: clear to auscultation bilaterally, no wheezing, rales or rhonchi Abdomen: soft nontender, nondistended, normal bowel sounds, no organomegaly Extremities: no cyanosis, clubbing or edema noted bilaterally Neuro: Cranial nerves II-XII intact, no focal neurological deficits  Data Reviewed: Basic Metabolic Panel:  Recent Labs Lab 03/29/15 0156  NA 140  K 3.5  CL 104    CO2 25  GLUCOSE 122*  BUN 14  CREATININE 1.09  CALCIUM 9.3   Liver Function Tests:  Recent Labs Lab 03/29/15 0156  AST 34  ALT 32  ALKPHOS 41  BILITOT 0.5  PROT 7.6  ALBUMIN 5.2*   No results for input(s): LIPASE, AMYLASE in the last 168 hours. No results for input(s): AMMONIA in the last 168 hours. CBC:  Recent Labs Lab 03/29/15 0156  WBC 7.3  NEUTROABS 4.1  HGB 14.7  HCT 41.5  MCV 88.3  PLT 240   Cardiac Enzymes:  Recent Labs Lab 03/29/15 0606  CKTOTAL 248   BNP (last 3 results) No results for input(s): BNP in the last 8760 hours.  ProBNP (last 3 results) No results for input(s): PROBNP in the last 8760 hours.  CBG: No results for input(s): GLUCAP in the last 168 hours.  Micro No results found for this or any previous visit (from the past 240 hour(s)).   Studies: No results found.  Scheduled Meds: . enoxaparin (LOVENOX) injection  40 mg Subcutaneous Q24H  . sodium chloride flush  3 mL Intravenous Q12H   Continuous Infusions: . 0.9 % NaCl with KCl 20 mEq / L 125 mL/hr at 03/29/15 0911       Time spent: 35 minutes    [facility] CenterELMAHI,Rasheen Schewe A  [facility] Pager [phone] If 7PM-7AM, please contact night-coverage at [email or web address], password [email or web address] 03/29/2015, 12:56 PM  LOS: 0 days

## 2015-03-29 NOTE — ED Notes (Signed)
Pt reportedly took 32 tablets of Diphenhydramine without known intent. Hx of such, not answering questions.

## 2015-03-29 NOTE — ED Notes (Addendum)
Pt resting with eyes closed; rise and fall of chest noted

## 2015-03-30 DIAGNOSIS — F4329 Adjustment disorder with other symptoms: Secondary | ICD-10-CM

## 2015-03-30 DIAGNOSIS — R000 Tachycardia, unspecified: Secondary | ICD-10-CM

## 2015-03-30 DIAGNOSIS — F845 Asperger's syndrome: Secondary | ICD-10-CM

## 2015-03-30 LAB — BASIC METABOLIC PANEL
Anion gap: 8 (ref 5–15)
BUN: 9 mg/dL (ref 6–20)
CHLORIDE: 109 mmol/L (ref 101–111)
CO2: 25 mmol/L (ref 22–32)
CREATININE: 1.06 mg/dL (ref 0.61–1.24)
Calcium: 9 mg/dL (ref 8.9–10.3)
GFR calc Af Amer: >60 mL/min (ref >60)
GFR calc non Af Amer: >60 mL/min (ref >60)
GLUCOSE: 69 mg/dL (ref 65–99)
POTASSIUM: 4 mmol/L (ref 3.5–5.1)
Sodium: 142 mmol/L (ref 135–145)

## 2015-03-30 NOTE — Plan of Care (Signed)
Problem: Activity: Goal: Risk for activity intolerance will decrease Outcome: Completed/Met Date Met:  03/30/15 Pt is out of bed with stand by assist

## 2015-03-30 NOTE — Progress Notes (Signed)
Received report from [employer] RN. This patient is ready for discharge, awaiting for his ride.

## 2015-03-30 NOTE — Discharge Summary (Addendum)
Physician Discharge Summary  Bobby CaterJoseph Sullivan [id number] DOB: [birth year] DOA: 03/29/2015  PCP: No PCP Per Patient  Admit date: 03/29/2015 Discharge date: 03/30/2015  Time spent: 40 minutes  Recommendations for Outpatient Follow-up:  1. Follow up with PCP in 1-2 weeks.   Discharge Diagnoses:  Principal Problem:   Diphenhydramine overdose of undetermined intent Active Problems:   Adjustment disorder with emotional disturbance   Asperger's syndrome   Sinus tachycardia (HCC)   Discharge Condition: Stable  Diet recommendation: Regular  Filed Weights   03/29/15 1700  Weight: 77.111 kg (170 lb)    History of present illness:  Bobby Sullivan is a 25 y.o. male with PMH of Asperger syndrome, insomnia, drug-induced seizures, multiple intentional diphenhydramine overdoses, and tobacco abuse who is brought into the ED following an apparent overdose involving diphenhydramine. This is the patient's third admission for intentional diphenhydramine overdose in the past 7 months. Per report, patient took a full bottle (32 count) of 50 mg diphenhydramine tablets. Further details regarding the history are unfortunately very scarce at this time the patient is unable to contribute. His mother is currently unreachable by phone. EMS was activated, apparently after the patient had been exhibiting bizarre behavior and an empty bottle was discovered.   In ED, patient was found to be afebrile, saturating well on room air, tachycardic to 130, tachypneic to 39, and with blood pressure stable. CBC and CMP are obtained and essentially normal. Ethanol level is less than 5, Tylenol level less than 10, and salicylate level less than 4. EKG features sinus tachycardia with a rate of 140 and suggestion of possible RVH with a secondary repolarization abnormality. Patient was bolused with 2 L of normal saline, 2 mg IV push of Ativan was given for sedation, and the patient was monitored on telemetry. Patient remains agitated and  tachycardic and will be admitted for ongoing evaluation and management of suspected diphenhydramine overdose.  Hospital Course:   Diphenhydramine overdose Undetermined intent, reporting that he wantsto sleep that is why he took 2 Benadryl. His mother reported 30 tablets, reported she found empty bottles in the trash. Psych evaluated him, NO suicidal intention, recommended NO psych admission. He is very consistent that he only took 2 tablets of Benadryl.  Insomnia Patient reported that he drinks 5-8 cups of coffee and red bull. Reported he was been told by his mother before that because she has history of ADHD everything has opposite effect. Reported he drinks coffee towards the night to help him with sleep, "but it never did" I explained to him the effect of caffeinated drink with sleep, avoid caffeinated drink after 2 pm.  Sinus tachycardia Patient denies any chest pain, denies any shortness of breath unlikely to be cardiac condition, no suspicion for PE. This is likely secondary to caffeine intoxication. This is resolved.   Procedures:  None  Consultations:  Psych  Discharge Exam: Filed Vitals:   03/30/15 0615 03/30/15 1509  BP: 113/61 117/52  Pulse: 84 84  Temp: 97.7 F (36.5 C) 98.1 F (36.7 C)  Resp: 20 20   General: Alert and awake, oriented x3, not in any acute distress. HEENT: anicteric sclera, pupils reactive to light and accommodation, EOMI CVS: S1-S2 clear, no murmur rubs or gallops Chest: clear to auscultation bilaterally, no wheezing, rales or rhonchi Abdomen: soft nontender, nondistended, normal bowel sounds, no organomegaly Extremities: no cyanosis, clubbing or edema noted bilaterally Neuro: Cranial nerves II-XII intact, no focal neurological deficits  Discharge Instructions   Discharge Instructions  Increase activity slowly    Complete by:  As directed           There are no discharge medications for this patient.  No Known  Allergies    The results of significant diagnostics from this hospitalization (including imaging, microbiology, ancillary and laboratory) are listed below for reference.    Significant Diagnostic Studies: No results found.  Microbiology: No results found for this or any previous visit (from the past 240 hour(s)).   Labs: Basic Metabolic Panel:  Recent Labs Lab 03/29/15 0156 03/30/15 0535  NA 140 142  K 3.5 4.0  CL 104 109  CO2 25 25  GLUCOSE 122* 69  BUN 14 9  CREATININE 1.09 1.06  CALCIUM 9.3 9.0   Liver Function Tests:  Recent Labs Lab 03/29/15 0156  AST 34  ALT 32  ALKPHOS 41  BILITOT 0.5  PROT 7.6  ALBUMIN 5.2*   No results for input(s): LIPASE, AMYLASE in the last 168 hours. No results for input(s): AMMONIA in the last 168 hours. CBC:  Recent Labs Lab 03/29/15 0156  WBC 7.3  NEUTROABS 4.1  HGB 14.7  HCT 41.5  MCV 88.3  PLT 240   Cardiac Enzymes:  Recent Labs Lab 03/29/15 0606  CKTOTAL 248   BNP: BNP (last 3 results) No results for input(s): BNP in the last 8760 hours.  ProBNP (last 3 results) No results for input(s): PROBNP in the last 8760 hours.  CBG: No results for input(s): GLUCAP in the last 168 hours.     Signed:  Clint Lipps MD.  [facility] 03/30/2015, 6:10 PM

## 2015-03-30 NOTE — Progress Notes (Signed)
Patient is A&O x4 calm and cooperative. Sitter is at bedside.

## 2015-03-30 NOTE — Progress Notes (Signed)
Patient claimed that he had a pair of pants, some money and 2 bank cards with him when he was brought in to ED. We checked ED and also the Security dept and they reported back that they don't have his belongings. [facility] and with his investigation, according to the staff that took care of him from ED, patient did not come in with a pair of pants and saw no money and bank cards. AC talked to the patient and informed him about it.

## 2015-03-30 NOTE — Progress Notes (Signed)
[facility] PROGRESS NOTE   Bobby CaterJoseph Sullivan [id number] DOB: 1990-08-12 DOA: 03/29/2015 PCP: No PCP Per Patient  HPI/Subjective: Patient is alert, awake and oriented 4, more reactive today and normal looking. Still reporting only taking 2 tablets of Benadryl.  Assessment/Plan: Principal Problem:   Diphenhydramine overdose of undetermined intent Active Problems:   Adjustment disorder with emotional disturbance   Asperger's syndrome   Sinus tachycardia (HCC)   Diphenhydramine overdose Undetermined intent, reporting that he wants a sleeve that why he took 2 Benadryl. His mother reported 30 tablets, she found empty bottles in the trash. Psych to evaluate the intent of the overdose  Insomnia Patient reported that he drinks 5-8 cups of coffee and red bull. Reported he was been told before that because she has history of ADHD everything has opposite effect. Reported he drinks coffee towards the night to help him with sleep, "but it never did" I explained to him the effect of caffeinated drink with her sleep.  Sinus tachycardia Patient denies any chest pain, denies any shortness of breath unlikely to be cardiac condition, no suspicion for PE. This is likely caffeine intoxication.  Code Status: Full Code Family Communication: Plan discussed with the patient. Disposition Plan: Remains inpatient Diet: Diet regular Room service appropriate?: Yes; Fluid consistency:: Thin  Consultants:  None  Procedures:  None  Antibiotics:  None (indicate start date, and stop date if known)   Objective: Filed Vitals:   03/29/15 2050 03/30/15 0615  BP: 108/66 113/61  Pulse: 81 84  Temp: 97.5 F (36.4 C) 97.7 F (36.5 C)  Resp: 18 20    Intake/Output Summary (Last 24 hours) at 03/30/15 1154 Last data filed at 03/29/15 1930  Gross per 24 hour  Intake    620 ml  Output      0 ml  Net    620 ml   Filed Weights   03/29/15 1700  Weight: 77.111 kg (170 lb)     Exam: General: Alert and awake, oriented x3, not in any acute distress. HEENT: anicteric sclera, pupils reactive to light and accommodation, EOMI CVS: S1-S2 clear, no murmur rubs or gallops Chest: clear to auscultation bilaterally, no wheezing, rales or rhonchi Abdomen: soft nontender, nondistended, normal bowel sounds, no organomegaly Extremities: no cyanosis, clubbing or edema noted bilaterally Neuro: Cranial nerves II-XII intact, no focal neurological deficits  Data Reviewed: Basic Metabolic Panel:  Recent Labs Lab 03/29/15 0156 03/30/15 0535  NA 140 142  K 3.5 4.0  CL 104 109  CO2 25 25  GLUCOSE 122* 69  BUN 14 9  CREATININE 1.09 1.06  CALCIUM 9.3 9.0   Liver Function Tests:  Recent Labs Lab 03/29/15 0156  AST 34  ALT 32  ALKPHOS 41  BILITOT 0.5  PROT 7.6  ALBUMIN 5.2*   No results for input(s): LIPASE, AMYLASE in the last 168 hours. No results for input(s): AMMONIA in the last 168 hours. CBC:  Recent Labs Lab 03/29/15 0156  WBC 7.3  NEUTROABS 4.1  HGB 14.7  HCT 41.5  MCV 88.3  PLT 240   Cardiac Enzymes:  Recent Labs Lab 03/29/15 0606  CKTOTAL 248   BNP (last 3 results) No results for input(s): BNP in the last 8760 hours.  ProBNP (last 3 results) No results for input(s): PROBNP in the last 8760 hours.  CBG: No results for input(s): GLUCAP in the last 168 hours.  Micro No results found for this or any previous visit (from the past 240 hour(s)).   Studies:  No results found.  Scheduled Meds: . enoxaparin (LOVENOX) injection  40 mg Subcutaneous Q24H  . sodium chloride flush  3 mL Intravenous Q12H   Continuous Infusions: . 0.9 % NaCl with KCl 20 mEq / L 100 mL (03/30/15 0742)       Time spent: 35 minutes    [facility] A  [facility] Pager [phone] If 7PM-7AM, please contact night-coverage at [email or web address], password [facility] 03/30/2015, 11:54 AM  LOS: 1 day

## 2015-03-30 NOTE — Plan of Care (Signed)
Problem: Safety: Goal: Ability to remain free from injury will improve Outcome: Completed/Met Date Met:  03/30/15 Sitter is at bedside

## 2015-03-30 NOTE — Consult Note (Signed)
[facility] Consult   Reason for Consult:  Medication overdose for insomnia  Referring Physician:  Dr. Myna Hidalgo Patient Identification: Bobby Sullivan MRN:  [id number] Principal Diagnosis: Diphenhydramine overdose of undetermined intent Diagnosis:   Patient Active Problem List   Diagnosis Date Noted  . Seizure disorder ([place]) [E95.284]   . Leukocytosis [D72.829] 12/16/2014  . Diphenhydramine overdose [T45.0X1A] 12/16/2014  . Sinus tachycardia ([place]) [R00.0] 12/16/2014  . Prolonged Q-T interval on ECG [I45.81] 12/16/2014  . Insomnia, uncontrolled [G47.00] 08/07/2014  . Diphenhydramine overdose of undetermined intent [T45.0X4A] 08/06/2014  . Asperger's syndrome [F84.5] 08/06/2014  . Adjustment disorder with emotional disturbance [F43.29] 06/15/2013    Total Time spent with patient: 1 hour  Subjective:   Bobby Sullivan is a 25 y.o. male patient admitted with diphenhydramine overdose.  HPI:  Bobby Sullivan is a 25 y.o. male seen, chart reviewed and case discussed with the staff RN for face-to-face psychiatric consultation and evaluation of medication overdose. Patient appeared as per his stated age, calm and cooperative. Patient denied current symptoms of depression, anxiety, psychosis, suicidal/homicidal ideation, intention or plans. Patient complains of insomnia and has been taken several different kind of sleeping aids without much benefit. Patient currently has been taking Unisom one or 2 tablets at bedtime for sleep. Patient stated he took 2 tablets 50 mg each taken yesterday and then started feeling stomach upset, nausea and vomiting. Patient was asked about taking 32 tablets of Benadryl w as he stated in the emergency department but patient denied. Patient also reportedly drinks 2 pots of coffee daily and 2-3 red bulls for energy. Patient reported he has been drinking coffee since he was 25 years old. Patient has no current psychiatrist provider or outpatient counseling services.  Patient reportedly stopped taking his ADHD medication when he entered ninth grade. 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 Patient is also diagnosed with Asperger syndrome, insomnia, drug-induced seizures, multiple intentional diphenhydramine overdoses, and tobacco abuse.   Medical history: This is the patient's third admission for intentional diphenhydramine overdose in the past 7 months. Per report, patient took a full bottle (32 count) of 50 mg diphenhydramine tablets. EMS was activated, apparently after the patient had been exhibiting bizarre behavior and an empty bottle was discovered. In ED, patient was found to be afebrile, saturating well on room air, tachycardic to 130, tachypneic to 39, and with blood pressure stable. CBC and CMP are obtained and essentially normal. Ethanol level is less than 5, Tylenol level less than 10, and salicylate level less than 4. EKG features sinus tachycardia with a rate of 140 and suggestion of possible RVH with a secondary repolarization abnormality. Patient was bolused with 2 L of normal saline, 2 mg IV push of Ativan was given for sedation, and the patient was monitored on telemetry. Patient remains agitated and tachycardic and will be admitted for ongoing evaluation and management of suspected diphenhydramine overdose  Past Psychiatric History:  Asperger syndrome, insomnia and history of ADHD. Patient reportedly follow-up with the primary care physician   as needed. Risk to Self: Is patient at risk for suicide?: Yes Risk to Others:   Prior Inpatient Therapy:   Prior Outpatient Therapy:    Past Medical History:  Past Medical History  Diagnosis Date  . Aspergers' syndrome   . Insomnia   . Anxiety    History reviewed. No pertinent past surgical history. Family History:  Family History  Problem Relation  Age of Onset  . Bipolar disorder Mother    Family Psychiatric  History:  Patient reported his mother has bipolar disorder and his brother has Schizophrenia  Social History:  History  Alcohol Use  . Yes    Comment: occasional     History  Drug Use  . Yes    Comment: Per triple C, benadryl, duster     Social History   Social History  . Marital Status: Married    Spouse Name: N/A  . Number of Children: N/A  . Years of Education: N/A   Social History Main Topics  . Smoking status: Current Every Day Smoker -- 0.10 packs/day    Types: Cigarettes  . Smokeless tobacco: None  . Alcohol Use: Yes     Comment: occasional  . Drug Use: Yes     Comment: Per triple C, benadryl, duster   . Sexual Activity: Not Asked   Other Topics Concern  . None   Social History Narrative   Additional Social History:    Allergies:  No Known Allergies  Labs:  Results for orders placed or performed during the hospital encounter of 03/29/15 (from the past 48 hour(s))  Comprehensive metabolic panel     Status: Abnormal   Collection Time: 03/29/15  1:56 AM  Result Value Ref Range   Sodium 140 135 - 145 mmol/L   Potassium 3.5 3.5 - 5.1 mmol/L   Chloride 104 101 - 111 mmol/L   CO2 25 22 - 32 mmol/L   Glucose, Bld 122 (H) 65 - 99 mg/dL   BUN 14 6 - 20 mg/dL   Creatinine, Ser 1.09 0.61 - 1.24 mg/dL   Calcium 9.3 8.9 - 10.3 mg/dL   Total Protein 7.6 6.5 - 8.1 g/dL   Albumin 5.2 (H) 3.5 - 5.0 g/dL   AST 34 15 - 41 U/L   ALT 32 17 - 63 U/L   Alkaline Phosphatase 41 38 - 126 U/L   Total Bilirubin 0.5 0.3 - 1.2 mg/dL   GFR calc non Af Amer >60 >60 mL/min   GFR calc Af Amer >60 >60 mL/min    Comment: (NOTE) The eGFR has been calculated using the CKD EPI equation. This calculation has not been validated in all clinical situations. eGFR's persistently <60 mL/min signify possible Chronic Kidney Disease.    Anion gap 11 5 - 15  CBC with Differential     Status: None   Collection Time: 03/29/15  1:56 AM  Result Value Ref Range   WBC 7.3 4.0 - 10.5 K/uL   RBC 4.70 4.22  - 5.81 MIL/uL   Hemoglobin 14.7 13.0 - 17.0 g/dL   HCT 41.5 39.0 - 52.0 %   MCV 88.3 78.0 - 100.0 fL   MCH 31.3 26.0 - 34.0 pg   MCHC 35.4 30.0 - 36.0 g/dL   RDW 12.5 11.5 - 15.5 %   Platelets 240 150 - 400 K/uL   Neutrophils Relative % 56 %   Neutro Abs 4.1 1.7 - 7.7 K/uL   Lymphocytes Relative 25 %   Lymphs Abs 1.9 0.7 - 4.0 K/uL   Monocytes Relative 14 %   Monocytes Absolute 1.0 0.1 - 1.0 K/uL   Eosinophils Relative 4 %   Eosinophils Absolute 0.3 0.0 - 0.7 K/uL   Basophils Relative 1 %   Basophils Absolute 0.1 0.0 - 0.1 K/uL  Ethanol     Status: None   Collection Time: 03/29/15  1:58 AM  Result Value Ref Range  Alcohol, Ethyl (B) <5 <5 mg/dL    Comment:        LOWEST DETECTABLE LIMIT FOR SERUM ALCOHOL IS 5 mg/dL FOR MEDICAL PURPOSES ONLY   Acetaminophen level     Status: Abnormal   Collection Time: 03/29/15  1:58 AM  Result Value Ref Range   Acetaminophen (Tylenol), Serum <10 (L) 10 - 30 ug/mL    Comment:        THERAPEUTIC CONCENTRATIONS VARY SIGNIFICANTLY. A RANGE OF 10-30 ug/mL MAY BE AN EFFECTIVE CONCENTRATION FOR MANY PATIENTS. HOWEVER, SOME ARE BEST TREATED AT CONCENTRATIONS OUTSIDE THIS RANGE. ACETAMINOPHEN CONCENTRATIONS >150 ug/mL AT 4 HOURS AFTER INGESTION AND >50 ug/mL AT 12 HOURS AFTER INGESTION ARE OFTEN ASSOCIATED WITH TOXIC REACTIONS.   Salicylate level     Status: None   Collection Time: 03/29/15  1:58 AM  Result Value Ref Range   Salicylate Lvl <2.9 2.8 - 30.0 mg/dL  Acetaminophen level     Status: Abnormal   Collection Time: 03/29/15  5:28 AM  Result Value Ref Range   Acetaminophen (Tylenol), Serum <10 (L) 10 - 30 ug/mL    Comment:        THERAPEUTIC CONCENTRATIONS VARY SIGNIFICANTLY. A RANGE OF 10-30 ug/mL MAY BE AN EFFECTIVE CONCENTRATION FOR MANY PATIENTS. HOWEVER, SOME ARE BEST TREATED AT CONCENTRATIONS OUTSIDE THIS RANGE. ACETAMINOPHEN CONCENTRATIONS >150 ug/mL AT 4 HOURS AFTER INGESTION AND >50 ug/mL AT 12 HOURS AFTER  INGESTION ARE OFTEN ASSOCIATED WITH TOXIC REACTIONS.   CK     Status: None   Collection Time: 03/29/15  6:06 AM  Result Value Ref Range   Total CK 248 49 - 397 U/L  Basic metabolic panel     Status: None   Collection Time: 03/30/15  5:35 AM  Result Value Ref Range   Sodium 142 135 - 145 mmol/L   Potassium 4.0 3.5 - 5.1 mmol/L   Chloride 109 101 - 111 mmol/L   CO2 25 22 - 32 mmol/L   Glucose, Bld 69 65 - 99 mg/dL   BUN 9 6 - 20 mg/dL   Creatinine, Ser 1.06 0.61 - 1.24 mg/dL   Calcium 9.0 8.9 - 10.3 mg/dL   GFR calc non Af Amer >60 >60 mL/min   GFR calc Af Amer >60 >60 mL/min    Comment: (NOTE) The eGFR has been calculated using the CKD EPI equation. This calculation has not been validated in all clinical situations. eGFR's persistently <60 mL/min signify possible Chronic Kidney Disease.    Anion gap 8 5 - 15    Current Facility-Administered Medications  Medication Dose Route Frequency Provider Last Rate Last Dose  . 0.9 % NaCl with KCl 20 mEq/ L  infusion   Intravenous Continuous Verlee Monte, MD 100 mL/hr at 03/30/15 0742 100 mL at 03/30/15 0742  . diazepam (VALIUM) injection 5 mg  5 mg Intravenous Q3H PRN Ilene Qua Opyd, MD      . enoxaparin (LOVENOX) injection 40 mg  40 mg Subcutaneous Q24H Vianne Bulls, MD   40 mg at 03/30/15 0806  . sodium chloride flush (NS) 0.9 % injection 3 mL  3 mL Intravenous Q12H Vianne Bulls, MD   3 mL at 03/29/15 2207    Musculoskeletal: Strength & Muscle Tone: within normal limits Gait & Station: unable to stand Patient leans: N/A  Psychiatric Specialty Exam: ROS patient presented with tachycardia, nausea and vomiting but currently feeling much better. Denied chest pain and shortness of breath.  No Fever-chills, No Headache, No  changes with Vision or hearing, reports vertigo No problems swallowing food or Liquids, No Chest pain, Cough or Shortness of Breath, No Abdominal pain, No Nausea or Vommitting, Bowel movements are regular, No  Blood in stool or Urine, No dysuria, No new skin rashes or bruises, No new joints pains-aches,  No new weakness, tingling, numbness in any extremity, No recent weight gain or loss, No polyuria, polydypsia or polyphagia,   A full 10 point Review of Systems was done, except as stated above, all other Review of Systems were negative.  Blood pressure 113/61, pulse 84, temperature 97.7 F (36.5 C), temperature source Oral, resp. rate 20, height 6' (1.829 m), weight 77.111 kg (170 lb), SpO2 100 %.Body mass index is 23.05 kg/(m^2).  General Appearance: Casual  Eye Contact::  Good  Speech:  Clear and Coherent  Volume:  Normal  Mood:  Euthymic  Affect:  Appropriate and Congruent  Thought Process:  Coherent and Goal Directed  Orientation:  Full (Time, Place, and Person)  Thought Content:  WDL  Suicidal Thoughts:  No  Homicidal Thoughts:  No  Memory:  Immediate;   Good Recent;   Fair  Judgement:  Impaired  Insight:  Fair  Psychomotor Activity:  Normal  Concentration:  Good  Recall:  Good  Fund of Knowledge:Good  Language: Good  Akathisia:  Negative  Handed:  Right  AIMS (if indicated):     Assets:  Communication Skills Desire for Improvement Financial Resources/Insurance Housing Leisure Time Physical Health Resilience Social Support Talents/Skills Transportation Vocational/Educational  ADL's:  Intact  Cognition: WNL  Sleep:      Treatment Plan Summary: Daily contact with patient to assess and evaluate symptoms and progress in treatment and Medication management  Safety contact: Patient denies active suicidal or homicidal ideation intention or plans he did not endorse Benadryl and intentional overdose Patient consented to contact his mother but Patient mother did not answer the phone for collateral information Patient counseled not to drink Coffee or caffeinated drinks and energy drinks after 2 PM to avoid insomnia  Patient does not meet criteria for inpatient psychiatric  hospitalization and recommended outpatient counseling services. Recommended no psychiatric medication management Appreciate psychiatric consultation and we sign of as of today Please contact [phone] or [phone] if needs further assistance   Disposition: Patient does not meet criteria for psychiatric inpatient admission. Supportive therapy provided about ongoing stressors.  Durward Parcel., MD 03/30/2015 12:30 PM

## 2015-03-30 NOTE — Progress Notes (Signed)
Patient is A&O x4, ambulatory. Patients mother was called with no answer. Secondary contact provided. Friend will be able to transport patient home. Discharge instructions reviewed, questions concerns denied.

## 2015-08-02 ENCOUNTER — Encounter (HOSPITAL_COMMUNITY): Payer: Self-pay | Admitting: *Deleted

## 2015-08-02 ENCOUNTER — Observation Stay (HOSPITAL_COMMUNITY)
Admission: EM | Admit: 2015-08-02 | Discharge: 2015-08-03 | Disposition: A | Discharge status: Home / Self Care | Payer: No Typology Code available for payment source | Attending: Internal Medicine | Admitting: Internal Medicine

## 2015-08-02 DIAGNOSIS — T450X2A Poisoning by antiallergic and antiemetic drugs, intentional self-harm, initial encounter: Principal | ICD-10-CM | POA: Insufficient documentation

## 2015-08-02 DIAGNOSIS — R000 Tachycardia, unspecified: Secondary | ICD-10-CM | POA: Diagnosis present

## 2015-08-02 DIAGNOSIS — T450X1A Poisoning by antiallergic and antiemetic drugs, accidental (unintentional), initial encounter: Secondary | ICD-10-CM | POA: Diagnosis present

## 2015-08-02 DIAGNOSIS — F411 Generalized anxiety disorder: Secondary | ICD-10-CM | POA: Diagnosis present

## 2015-08-02 DIAGNOSIS — T50992A Poisoning by other drugs, medicaments and biological substances, intentional self-harm, initial encounter: Secondary | ICD-10-CM | POA: Insufficient documentation

## 2015-08-02 DIAGNOSIS — F845 Asperger's syndrome: Secondary | ICD-10-CM | POA: Insufficient documentation

## 2015-08-02 DIAGNOSIS — G40909 Epilepsy, unspecified, not intractable, without status epilepticus: Secondary | ICD-10-CM

## 2015-08-02 DIAGNOSIS — T450X4A Poisoning by antiallergic and antiemetic drugs, undetermined, initial encounter: Secondary | ICD-10-CM | POA: Diagnosis present

## 2015-08-02 DIAGNOSIS — N179 Acute kidney failure, unspecified: Secondary | ICD-10-CM | POA: Insufficient documentation

## 2015-08-02 DIAGNOSIS — G4700 Insomnia, unspecified: Secondary | ICD-10-CM | POA: Insufficient documentation

## 2015-08-02 DIAGNOSIS — F17210 Nicotine dependence, cigarettes, uncomplicated: Secondary | ICD-10-CM | POA: Insufficient documentation

## 2015-08-02 DIAGNOSIS — E876 Hypokalemia: Secondary | ICD-10-CM | POA: Insufficient documentation

## 2015-08-02 LAB — CBC WITH DIFFERENTIAL/PLATELET
BASOS ABS: 0 10*3/uL (ref 0.0–0.1)
BASOS PCT: 0 %
Eosinophils Absolute: 0.1 10*3/uL (ref 0.0–0.7)
Eosinophils Relative: 1 %
HEMATOCRIT: 40.3 % (ref 39.0–52.0)
HEMOGLOBIN: 14.7 g/dL (ref 13.0–17.0)
Lymphocytes Relative: 20 %
Lymphs Abs: 2 10*3/uL (ref 0.7–4.0)
MCH: 30.9 pg (ref 26.0–34.0)
MCHC: 36.5 g/dL — ABNORMAL HIGH (ref 30.0–36.0)
MCV: 84.8 fL (ref 78.0–100.0)
Monocytes Absolute: 0.9 10*3/uL (ref 0.1–1.0)
Monocytes Relative: 9 %
NEUTROS ABS: 7 10*3/uL (ref 1.7–7.7)
NEUTROS PCT: 70 %
Platelets: 294 10*3/uL (ref 150–400)
RBC: 4.75 MIL/uL (ref 4.22–5.81)
RDW: 12.2 % (ref 11.5–15.5)
WBC: 10 10*3/uL (ref 4.0–10.5)

## 2015-08-02 LAB — COMPREHENSIVE METABOLIC PANEL
ALBUMIN: 5.5 g/dL — AB (ref 3.5–5.0)
ALT: 18 U/L (ref 17–63)
ANION GAP: 11 (ref 5–15)
AST: 28 U/L (ref 15–41)
Alkaline Phosphatase: 44 U/L (ref 38–126)
BILIRUBIN TOTAL: 0.9 mg/dL (ref 0.3–1.2)
BUN: 13 mg/dL (ref 6–20)
CO2: 22 mmol/L (ref 22–32)
Calcium: 9 mg/dL (ref 8.9–10.3)
Chloride: 105 mmol/L (ref 101–111)
Creatinine, Ser: 1.23 mg/dL (ref 0.61–1.24)
GFR calc Af Amer: >60 mL/min (ref >60)
Glucose, Bld: 101 mg/dL — ABNORMAL HIGH (ref 65–99)
POTASSIUM: 3.2 mmol/L — AB (ref 3.5–5.1)
SODIUM: 138 mmol/L (ref 135–145)
TOTAL PROTEIN: 8.3 g/dL — AB (ref 6.5–8.1)

## 2015-08-02 LAB — RAPID URINE DRUG SCREEN, HOSP PERFORMED
Amphetamines: NOT DETECTED
BARBITURATES: NOT DETECTED
BENZODIAZEPINES: POSITIVE — AB
COCAINE: NOT DETECTED
Opiates: NOT DETECTED
Tetrahydrocannabinol: NOT DETECTED

## 2015-08-02 LAB — SALICYLATE LEVEL: Salicylate Lvl: <4 mg/dL (ref 2.8–30.0)

## 2015-08-02 LAB — ACETAMINOPHEN LEVEL: Acetaminophen (Tylenol), Serum: <10 ug/mL — ABNORMAL LOW (ref 10–30)

## 2015-08-02 LAB — MAGNESIUM: MAGNESIUM: 2.2 mg/dL (ref 1.7–2.4)

## 2015-08-02 LAB — ETHANOL

## 2015-08-02 MED ORDER — SODIUM CHLORIDE 0.9 % IV SOLN
INTRAVENOUS | Status: AC
  Administered 2015-08-02 – 2015-08-03 (×2): via INTRAVENOUS

## 2015-08-02 MED ORDER — POTASSIUM CHLORIDE 10 MEQ/100ML IV SOLN
10.0000 meq | INTRAVENOUS | Status: DC
Start: 2015-08-02 — End: 2015-08-02

## 2015-08-02 MED ORDER — ONDANSETRON HCL 4 MG PO TABS: 4.0000 mg | ORAL_TABLET | Freq: Four times a day (QID) | ORAL | Status: DC | PRN

## 2015-08-02 MED ORDER — SODIUM CHLORIDE 0.9% FLUSH: 3.0000 mL | Freq: Two times a day (BID) | INTRAVENOUS | Status: DC

## 2015-08-02 MED ORDER — POTASSIUM CHLORIDE CRYS ER 20 MEQ PO TBCR
40.0000 meq | EXTENDED_RELEASE_TABLET | Freq: Once | ORAL | Status: AC
  Administered 2015-08-02: 40 meq via ORAL
  Filled 2015-08-02: qty 2

## 2015-08-02 MED ORDER — ONDANSETRON HCL 4 MG/2ML IJ SOLN: 4.0000 mg | Freq: Four times a day (QID) | INTRAMUSCULAR | Status: DC | PRN

## 2015-08-02 NOTE — ED Notes (Signed)
Patient's mother states she is unable to manage patient at home.  She states patient went missing at home this afternoon and upon his return, she and her other son realized patient had apparently [facility] and Coricidin.  Mother states patient is typically alert and oriented and conversant.

## 2015-08-02 NOTE — ED Notes (Signed)
Patient from home after taking @30  25 mg Benadryl and 16 Coricidin OTC.  Patient confused, unsure of time, date, place and events...referring to "a mission" he must complete, but unable to state nature of mission.  Patient denies trying to hurt himself and when asked why he took the medications, states it was to treat his allergies.  Patient has fine tremors in upper extremities on exam with bilateral nystagmus.  Patient denies N/V/D.  Patient tachy, sinus with HR in 140's.

## 2015-08-02 NOTE — ED Provider Notes (Signed)
CSN: [id number]     Arrival date & time 08/02/15  1745 History   First MD Initiated Contact with Patient 08/02/15 1750     Chief Complaint  Patient presents with  . Drug Overdose     (Consider location/radiation/quality/duration/timing/severity/associated sxs/prior Treatment) HPI Comments: Patient here after allegedly ingesting a 30 25 mg Benadryl tablets and 16 tablets of over-the-counter medication for colds. Review the old chart shows that he has done this before in the past most recently in March of this year. Patient states that he took 2 tablets of Benadryl this evening because of allergies to his cat. Patient denies at this was a suicide attempt. Current presentation is exactly the same as it was back in March. Denies any other ingestions at this time. Does admit to using alcohol today. Denies responding to internal similar. Mother found in 2 containers and called EMS and patient transported here  Patient is a 25 y.o. male presenting with Overdose. The history is provided by the patient.  Drug Overdose    Past Medical History  Diagnosis Date  . Aspergers' syndrome   . Insomnia   . Anxiety    History reviewed. No pertinent past surgical history. Family History  Problem Relation Age of Onset  . Bipolar disorder Mother    Social History  Substance Use Topics  . Smoking status: Current Every Day Smoker -- 0.10 packs/day    Types: Cigarettes  . Smokeless tobacco: None  . Alcohol Use: Yes     Comment: occasional    Review of Systems  All other systems reviewed and are negative.     Allergies  Review of patient's allergies indicates no known allergies.  Home Medications   Prior to Admission medications   Not on File   BP 143/93 mmHg  Pulse 143  Temp(Src) 98.1 F (36.7 C) (Oral)  Resp 16  SpO2 96% Physical Exam  Constitutional: He is oriented to person, place, and time. He appears well-developed and well-nourished.  Non-toxic appearance. No distress.  HENT:   Head: Normocephalic and atraumatic.  Eyes: Conjunctivae, EOM and lids are normal. Pupils are equal, round, and reactive to light.  Neck: Normal range of motion. Neck supple. No tracheal deviation present. No thyroid mass present.  Cardiovascular: Regular rhythm and normal heart sounds.  Tachycardia present.  Exam reveals no gallop.   No murmur heard. Pulmonary/Chest: Effort normal and breath sounds normal. No stridor. No respiratory distress. He has no decreased breath sounds. He has no wheezes. He has no rhonchi. He has no rales.  Abdominal: Soft. Normal appearance and bowel sounds are normal. He exhibits no distension. There is no tenderness. There is no rebound and no CVA tenderness.  Musculoskeletal: Normal range of motion. He exhibits no edema or tenderness.  Neurological: He is alert and oriented to person, place, and time. He has normal strength. No cranial nerve deficit or sensory deficit. GCS eye subscore is 4. GCS verbal subscore is 5. GCS motor subscore is 6.  Skin: Skin is warm and dry. No abrasion and no rash noted.  Psychiatric: His affect is blunt. His speech is delayed. He is slowed. He expresses no suicidal ideation.  Nursing note and vitals reviewed.   ED Course  Procedures (including critical care time) Labs Review Labs Reviewed  COMPREHENSIVE METABOLIC PANEL  ETHANOL  CBC WITH DIFFERENTIAL/PLATELET  URINE RAPID DRUG SCREEN, HOSP PERFORMED  SALICYLATE LEVEL  ACETAMINOPHEN LEVEL    Imaging Review No results found. I have personally reviewed and evaluated  these images and lab results as part of my medical decision-making.   EKG Interpretation   Date/Time:  Saturday August 02 2015 17:51:54 EDT Ventricular Rate:  145 PR Interval:    QRS Duration: 100 QT Interval:  237 QTC Calculation: 368 R Axis:   79 Text Interpretation:  Sinus tachycardia Consider right atrial enlargement  RSR' in V1 or V2, probably normal variant Probable LVH with secondary  repol abnrm  Confirmed by Harleen Fineberg  MD, Ronalda Walpole ([id number]) on 08/02/2015 7:48:50 PM      MDM   Final diagnoses:  None    Patient tachycardia treated with IV fluids. Will be admitted for observation to telemetry    Lorre NickAnthony Aarica Wax, MD 08/02/15 1949

## 2015-08-02 NOTE — ED Notes (Signed)
Bed: RESA Expected date:  Expected time:  Means of arrival:  Comments: EMS/overdose 

## 2015-08-02 NOTE — ED Notes (Signed)
Pt states unable to give urine sample at this time 

## 2015-08-02 NOTE — H&P (Signed)
Bobby CaterJoseph Litsey ZDG:[id number] DOB: 28-Sep-1990 DOA: 08/02/2015     PCP: No PCP Per Patient   Outpatient Specialists: none   Patient coming from:  home Lives  With family    Chief Complaint: Benadryl overdose  HPI: Bobby Sullivan is a 25 y.o. male with medical history significant of Asperger syndrome, insomnia, drug-induced seizures, multiple intentional diphenhydramine overdoses, and tobacco abuse    Presented with overdose of over-the-counter Benadryl.  He was known to ingest with 30 tablets of 25 mg Benadryl and 16 tablets all Coricidin over-the-counter  that contains dextromethorphan (a cough suppressant) and chlorpheniramine maleate (an antihistamine) Patient denies taking it. His mother found empty containers and called 911. When questioned about how much he  only reports 1- 2 pills of Banadryl to help him sleep. Patient does not have full understanding of the consequences of his actions.  denies suicidal ideation. He did admit to using alcohol. this is 4th event in past 9 months. Heart rate up to 150s initially Patient also reports that he drinks large amount of dried coffee prior to bed to help him sleep but it doesn't help.    Regarding pertinent Chronic problems: Previous similar admission in March 2017   IN ER: Temperature 98.1 heart rate up to 143 now down to 135 respirations 35 blood pressure 137/90 WBC and 10 hemoglobin 14.7 potassium 3.2    Hospitalist was called for admission for Benadryl overdose unclear intent  Review of Systems:    Pertinent positives include: Insomnia confusion  Constitutional:  No weight loss, night sweats, Fevers, chills, fatigue, weight loss  HEENT:  No headaches, Difficulty swallowing,Tooth/dental problems,Sore throat,  No sneezing, itching, ear ache, nasal congestion, post nasal drip,  Cardio-vascular:  No chest pain, Orthopnea, PND, anasarca, dizziness, palpitations.no Bilateral lower extremity swelling  GI:  No heartburn, indigestion,  abdominal pain, nausea, vomiting, diarrhea, change in bowel habits, loss of appetite, melena, blood in stool, hematemesis Resp:  no shortness of breath at rest. No dyspnea on exertion, No excess mucus, no productive cough, No non-productive cough, No coughing up of blood.No change in color of mucus.No wheezing. Skin:  no rash or lesions. No jaundice GU:  no dysuria, change in color of urine, no urgency or frequency. No straining to urinate.  No flank pain.  Musculoskeletal:  No joint pain or no joint swelling. No decreased range of motion. No back pain.  Psych:  No change in mood or affect. No depression or anxiety. No memory loss.  Neuro: no localizing neurological complaints, no tingling, no weakness, no double vision, no gait abnormality, no slurred speech, no confusion  As per HPI otherwise 10 point review of systems negative.   Past Medical History: Past Medical History  Diagnosis Date  . Aspergers' syndrome   . Insomnia   . Anxiety    History reviewed. No pertinent past surgical history.   Social History:  Ambulatory  independently      reports that he has been smoking Cigarettes.  He has been smoking about 0.10 packs per day. He does not have any smokeless tobacco history on file. He reports that he drinks alcohol. He reports that he uses illicit drugs.  Allergies:  No Known Allergies     Family History:    Family History  Problem Relation Age of Onset  . Bipolar disorder Mother     Medications: Prior to Admission medications   Medication Sig Start Date End Date Taking? Authorizing Provider  acetaminophen (TYLENOL) 500 MG tablet Take  1,000 mg by mouth 2 (two) times daily as needed for moderate pain or headache.   Yes Historical Provider, MD  MIRTAZAPINE PO Take 1 tablet by mouth at bedtime.   Yes Historical Provider, MD    Physical Exam: Patient Vitals for the past 24 hrs:  BP Temp Temp src Pulse Resp SpO2  08/02/15 1937 137/90 mmHg - - - (!) 35 -    08/02/15 1846 140/95 mmHg - - (!) 135 25 97 %  08/02/15 1752 143/93 mmHg 98.1 F (36.7 C) Oral (!) 143 16 96 %    1. General:  in No Acute distress 2. Psychological: Alert and Oriented 3. Head/ENT:    Dry Mucous Membranes                          Head Non traumatic, neck supple                          Normal  Dentition 4. SKIN:   decreased Skin turgor,  Skin clean Dry and intact no rash 5. Heart:  Rapid but Regular rate and rhythm no  Murmur, Rub or gallop 6. Lungs:  Clear to auscultation bilaterally, no wheezes or crackles   7. Abdomen: Soft, non-tender, Non distended 8. Lower extremities: no clubbing, cyanosis, or edema 9. Neurologically Grossly intact, moving all 4 extremities equally 10. MSK: Normal range of motion   body mass index is unknown because there is no weight on file.  Labs on Admission:   Labs on Admission: I have personally reviewed following labs and imaging studies  CBC:  Recent Labs Lab 08/02/15 1804  WBC 10.0  NEUTROABS 7.0  HGB 14.7  HCT 40.3  MCV 84.8  PLT 294   Basic Metabolic Panel:  Recent Labs Lab 08/02/15 1804  NA 138  K 3.2*  CL 105  CO2 22  GLUCOSE 101*  BUN 13  CREATININE 1.23  CALCIUM 9.0   GFR: CrCl cannot be calculated (Unknown ideal weight.). Liver Function Tests:  Recent Labs Lab 08/02/15 1804  AST 28  ALT 18  ALKPHOS 44  BILITOT 0.9  PROT 8.3*  ALBUMIN 5.5*   No results for input(s): LIPASE, AMYLASE in the last 168 hours. No results for input(s): AMMONIA in the last 168 hours. Coagulation Profile: No results for input(s): INR, PROTIME in the last 168 hours. Cardiac Enzymes: No results for input(s): CKTOTAL, CKMB, CKMBINDEX, TROPONINI in the last 168 hours. BNP (last 3 results) No results for input(s): PROBNP in the last 8760 hours. HbA1C: No results for input(s): HGBA1C in the last 72 hours. CBG: No results for input(s): GLUCAP in the last 168 hours. Lipid Profile: No results for input(s): CHOL, HDL,  LDLCALC, TRIG, CHOLHDL, LDLDIRECT in the last 72 hours. Thyroid Function Tests: No results for input(s): TSH, T4TOTAL, FREET4, T3FREE, THYROIDAB in the last 72 hours. Anemia Panel: No results for input(s): VITAMINB12, FOLATE, FERRITIN, TIBC, IRON, RETICCTPCT in the last 72 hours. Urine analysis: No results found for: COLORURINE, APPEARANCEUR, LABSPEC, PHURINE, GLUCOSEU, HGBUR, BILIRUBINUR, KETONESUR, PROTEINUR, UROBILINOGEN, NITRITE, LEUKOCYTESUR Sepsis Labs: @LABRCNTIP (procalcitonin:4,lacticidven:4) )No results found for this or any previous visit (from the past 240 hour(s)).     UA ordered  No results found for: HGBA1C  CrCl cannot be calculated (Unknown ideal weight.).  BNP (last 3 results) No results for input(s): PROBNP in the last 8760 hours.   ECG REPORT  Independently reviewed Rate: 145  Rhythm:  Sinus tachycardia ST&T Change: No acute ischemic changes  QTC 368 QRS 100   There were no vitals filed for this visit.   Cultures: No results found for: SDES, SPECREQUEST, CULT, REPTSTATUS   Radiological Exams on Admission: No results found.  Chart has been reviewed   Assessment/Plan   25 y.o. male with medical history significant of Asperger syndrome, insomnia, drug-induced seizures, multiple intentional diphenhydramine overdoses, and tobacco abuse admitted for Benadryl overdose  Present on Admission:  . Diphenhydramine overdose of undetermined intent - unsure of true intent patient dines suicidal ideation but also insists he only took 2 tablets. He is unsure of true consequences of his actions. Finished his from psychiatry evaluation and follow-up as an outpatient  . Sinus tachycardia (HCC) - rehydrate, monitor in stepdown  Hypokalemia - replace potasium  Other plan as per orders.  DVT prophylaxis:  SCD    Code Status:  FULL CODE  Family Communication:   Family  at  Bedside  plan of care was discussed with mother  Disposition Plan:    To home once workup is  complete and patient is stable   Consults called: Behavioral Health consulted  Admission status:  obs    Level of care       SDU      I have spent a total of 65 min on this admission  40 min were spent face to face with family and patient in s iscussion of treatment options   Mirra Basilio 08/02/2015, 8:51 PM  [facility]  Pager [phone]   after 2 AM please page floor coverage PA If 7AM-7PM, please contact the day team taking care of the patient  [email or web address]  Password [email or web address]

## 2015-08-02 NOTE — ED Notes (Signed)
Per EMS - patient comes from home following ingestion of 30 25 mg Benadryl and 16 Coricidin OTC. Time of ingestion unknown, mother found empty containers and called 911.  Patient has hx of OD and attempted suicide. Patient on Autism spectrum, alert and somewhat incoherent.  Patient denies SI/HI and states he only took 1-2 pills.  HR 150, 148/76, 100% on RA, RR 22 unlabored, Glucose 99.  18 gauge LAC.

## 2015-08-03 DIAGNOSIS — F411 Generalized anxiety disorder: Secondary | ICD-10-CM | POA: Diagnosis present

## 2015-08-03 DIAGNOSIS — T450X4A Poisoning by antiallergic and antiemetic drugs, undetermined, initial encounter: Secondary | ICD-10-CM

## 2015-08-03 LAB — COMPREHENSIVE METABOLIC PANEL
ALBUMIN: 4.2 g/dL (ref 3.5–5.0)
ALK PHOS: 36 U/L — AB (ref 38–126)
ALT: 15 U/L — AB (ref 17–63)
ANION GAP: 5 (ref 5–15)
AST: 19 U/L (ref 15–41)
BILIRUBIN TOTAL: 0.9 mg/dL (ref 0.3–1.2)
BUN: 14 mg/dL (ref 6–20)
CALCIUM: 8.5 mg/dL — AB (ref 8.9–10.3)
CO2: 26 mmol/L (ref 22–32)
CREATININE: 1.07 mg/dL (ref 0.61–1.24)
Chloride: 108 mmol/L (ref 101–111)
GFR calc Af Amer: >60 mL/min (ref >60)
GFR calc non Af Amer: >60 mL/min (ref >60)
GLUCOSE: 82 mg/dL (ref 65–99)
Potassium: 4 mmol/L (ref 3.5–5.1)
SODIUM: 139 mmol/L (ref 135–145)
TOTAL PROTEIN: 6.8 g/dL (ref 6.5–8.1)

## 2015-08-03 LAB — CBC
HCT: 37.3 % — ABNORMAL LOW (ref 39.0–52.0)
Hemoglobin: 13.2 g/dL (ref 13.0–17.0)
MCH: 30.7 pg (ref 26.0–34.0)
MCHC: 35.4 g/dL (ref 30.0–36.0)
MCV: 86.7 fL (ref 78.0–100.0)
PLATELETS: 230 10*3/uL (ref 150–400)
RBC: 4.3 MIL/uL (ref 4.22–5.81)
RDW: 12.4 % (ref 11.5–15.5)
WBC: 9.1 10*3/uL (ref 4.0–10.5)

## 2015-08-03 LAB — PHOSPHORUS: Phosphorus: 2.8 mg/dL (ref 2.5–4.6)

## 2015-08-03 LAB — TSH: TSH: 0.698 u[IU]/mL (ref 0.350–4.500)

## 2015-08-03 LAB — MAGNESIUM: MAGNESIUM: 2.3 mg/dL (ref 1.7–2.4)

## 2015-08-03 MED ORDER — LORAZEPAM 0.5 MG PO TABS: 0.5000 mg | ORAL_TABLET | Freq: Four times a day (QID) | ORAL | Status: DC | PRN

## 2015-08-03 NOTE — Consult Note (Signed)
[facility] Consult   Reason for Consult: unintentional overdose on allergy medication Referring Physician:  Dr. Linton Rump Patient Identification: Bobby Sullivan MRN:  [id number] Principal Diagnosis: Generalized anxiety disorder Diagnosis:   Patient Active Problem List   Diagnosis Date Noted  . Generalized anxiety disorder [F41.1] 08/03/2015    Priority: High  . Diphenhydramine overdose [T45.0X1A] 12/16/2014    Priority: High  . Seizure disorder ([place]) [J88.416]   . Leukocytosis [D72.829] 12/16/2014  . Sinus tachycardia ([place]) [R00.0] 12/16/2014  . Prolonged Q-T interval on ECG [I45.81] 12/16/2014  . Insomnia, uncontrolled [G47.00] 08/07/2014  . Diphenhydramine overdose of undetermined intent [T45.0X4A] 08/06/2014  . Asperger's syndrome [F84.5] 08/06/2014  . Adjustment disorder with emotional disturbance [F43.29] 06/15/2013    Total Time spent with patient: 1 hour  Subjective:   Bobby Sullivan is a 25 y.o. male patient admitted with unintentional medication overdose.  HPI: Thanks for asking me to do a psychiatric consult on Bobby Sullivan, patient with history of Asperger's disorder, Generalized anxiety disorder and primary insomnia. Patient lives with his mother since 2014. Patient denies ingesting  30 tablets of  25 mg Benadryl tablets, however, he states that he took 2 tablets of allergy medicine and 15 mg of Remeron prescribed for insomnia and anxiety at [facility]. He reports being allergic to his cat. Patient acknowledge having severe anxiety but denies depressive symptoms, psychosis, delusional thinking, suicidal or homicidal ideations, intent or plan. Also, patient denies previous history of suicide attempt. Patient gave me permission to speak with his mother who states that she is happy for her son to be discharged back home today. She states that patient has a good job, family support and will ensure that she continues to get treatment from [facility]. Patient denies drugs or  alcohol abuse. He did 4 years in the [employer] after graduated from high school(2010-2014).  Past Psychiatric History: as above  Risk to Self: Is patient at risk for suicide?: No Risk to Others:   Prior Inpatient Therapy:   Prior Outpatient Therapy:    Past Medical History:  Past Medical History  Diagnosis Date  . Aspergers' syndrome   . Insomnia   . Anxiety    History reviewed. No pertinent past surgical history. Family History:  Family History  Problem Relation Age of Onset  . Bipolar disorder Mother    Family Psychiatric  History: Mother has Bipolar disorder, Older brother was diagnosed with Schizophrenia Social History:  History  Alcohol Use  . Yes    Comment: occasional     History  Drug Use  . Yes    Comment: Per triple C, benadryl, duster     Social History   Social History  . Marital Status: Married    Spouse Name: N/A  . Number of Children: N/A  . Years of Education: High School Grade   Social History Main Topics  . Smoking status: Current Every Day Smoker -- 0.10 packs/day    Types: Cigarettes  . Smokeless tobacco: None  . Alcohol Use: Yes     Comment: occasional  . Drug Use: Yes     Comment: Per triple C, benadryl, duster   . Sexual Activity: Not Asked   Other Topics Concern  . None   Social History Narrative   Additional Social History:    Allergies:  No Known Allergies  Labs:  Results for orders placed or performed during the hospital encounter of 08/02/15 (from the past 48 hour(s))  Comprehensive metabolic panel     Status:  Abnormal   Collection Time: 08/02/15  6:04 PM  Result Value Ref Range   Sodium 138 135 - 145 mmol/L   Potassium 3.2 (L) 3.5 - 5.1 mmol/L   Chloride 105 101 - 111 mmol/L   CO2 22 22 - 32 mmol/L   Glucose, Bld 101 (H) 65 - 99 mg/dL   BUN 13 6 - 20 mg/dL   Creatinine, Ser 1.23 0.61 - 1.24 mg/dL   Calcium 9.0 8.9 - 10.3 mg/dL   Total Protein 8.3 (H) 6.5 - 8.1 g/dL   Albumin 5.5 (H) 3.5 - 5.0 g/dL   AST 28 15 - 41  U/L   ALT 18 17 - 63 U/L   Alkaline Phosphatase 44 38 - 126 U/L   Total Bilirubin 0.9 0.3 - 1.2 mg/dL   GFR calc non Af Amer >60 >60 mL/min   GFR calc Af Amer >60 >60 mL/min    Comment: (NOTE) The eGFR has been calculated using the CKD EPI equation. This calculation has not been validated in all clinical situations. eGFR's persistently <60 mL/min signify possible Chronic Kidney Disease.    Anion gap 11 5 - 15  CBC WITH DIFFERENTIAL     Status: Abnormal   Collection Time: 08/02/15  6:04 PM  Result Value Ref Range   WBC 10.0 4.0 - 10.5 K/uL   RBC 4.75 4.22 - 5.81 MIL/uL   Hemoglobin 14.7 13.0 - 17.0 g/dL   HCT 40.3 39.0 - 52.0 %   MCV 84.8 78.0 - 100.0 fL   MCH 30.9 26.0 - 34.0 pg   MCHC 36.5 (H) 30.0 - 36.0 g/dL   RDW 12.2 11.5 - 15.5 %   Platelets 294 150 - 400 K/uL   Neutrophils Relative % 70 %   Neutro Abs 7.0 1.7 - 7.7 K/uL   Lymphocytes Relative 20 %   Lymphs Abs 2.0 0.7 - 4.0 K/uL   Monocytes Relative 9 %   Monocytes Absolute 0.9 0.1 - 1.0 K/uL   Eosinophils Relative 1 %   Eosinophils Absolute 0.1 0.0 - 0.7 K/uL   Basophils Relative 0 %   Basophils Absolute 0.0 0.0 - 0.1 K/uL  Ethanol     Status: None   Collection Time: 08/02/15  6:05 PM  Result Value Ref Range   Alcohol, Ethyl (B) <5 <5 mg/dL    Comment:        LOWEST DETECTABLE LIMIT FOR SERUM ALCOHOL IS 5 mg/dL FOR MEDICAL PURPOSES ONLY   Salicylate level     Status: None   Collection Time: 08/02/15  6:05 PM  Result Value Ref Range   Salicylate Lvl <6.2 2.8 - 30.0 mg/dL  Acetaminophen level     Status: Abnormal   Collection Time: 08/02/15  6:05 PM  Result Value Ref Range   Acetaminophen (Tylenol), Serum <10 (L) 10 - 30 ug/mL    Comment:        THERAPEUTIC CONCENTRATIONS VARY SIGNIFICANTLY. A RANGE OF 10-30 ug/mL MAY BE AN EFFECTIVE CONCENTRATION FOR MANY PATIENTS. HOWEVER, SOME ARE BEST TREATED AT CONCENTRATIONS OUTSIDE THIS RANGE. ACETAMINOPHEN CONCENTRATIONS >150 ug/mL AT 4 HOURS AFTER INGESTION  AND >50 ug/mL AT 12 HOURS AFTER INGESTION ARE OFTEN ASSOCIATED WITH TOXIC REACTIONS.   Magnesium     Status: None   Collection Time: 08/02/15  6:10 PM  Result Value Ref Range   Magnesium 2.2 1.7 - 2.4 mg/dL  Urine rapid drug screen (hosp performed)not at [facility]     Status: Abnormal   Collection Time: 08/02/15  10:00 PM  Result Value Ref Range   Opiates NONE DETECTED NONE DETECTED   Cocaine NONE DETECTED NONE DETECTED   Benzodiazepines POSITIVE (A) NONE DETECTED   Amphetamines NONE DETECTED NONE DETECTED   Tetrahydrocannabinol NONE DETECTED NONE DETECTED   Barbiturates NONE DETECTED NONE DETECTED    Comment:        DRUG SCREEN FOR MEDICAL PURPOSES ONLY.  IF CONFIRMATION IS NEEDED FOR ANY PURPOSE, NOTIFY LAB WITHIN 5 DAYS.        LOWEST DETECTABLE LIMITS FOR URINE DRUG SCREEN Drug Class       Cutoff (ng/mL) Amphetamine      1000 Barbiturate      200 Benzodiazepine   161 Tricyclics       096 Opiates          300 Cocaine          300 THC              50   Magnesium     Status: None   Collection Time: 08/03/15  4:54 AM  Result Value Ref Range   Magnesium 2.3 1.7 - 2.4 mg/dL  Phosphorus     Status: None   Collection Time: 08/03/15  4:54 AM  Result Value Ref Range   Phosphorus 2.8 2.5 - 4.6 mg/dL  TSH     Status: None   Collection Time: 08/03/15  4:54 AM  Result Value Ref Range   TSH 0.698 0.350 - 4.500 uIU/mL  Comprehensive metabolic panel     Status: Abnormal   Collection Time: 08/03/15  4:54 AM  Result Value Ref Range   Sodium 139 135 - 145 mmol/L   Potassium 4.0 3.5 - 5.1 mmol/L    Comment: DELTA CHECK NOTED REPEATED TO VERIFY NO VISIBLE HEMOLYSIS    Chloride 108 101 - 111 mmol/L   CO2 26 22 - 32 mmol/L   Glucose, Bld 82 65 - 99 mg/dL   BUN 14 6 - 20 mg/dL   Creatinine, Ser 1.07 0.61 - 1.24 mg/dL   Calcium 8.5 (L) 8.9 - 10.3 mg/dL   Total Protein 6.8 6.5 - 8.1 g/dL   Albumin 4.2 3.5 - 5.0 g/dL   AST 19 15 - 41 U/L   ALT 15 (L) 17 - 63 U/L   Alkaline  Phosphatase 36 (L) 38 - 126 U/L   Total Bilirubin 0.9 0.3 - 1.2 mg/dL   GFR calc non Af Amer >60 >60 mL/min   GFR calc Af Amer >60 >60 mL/min    Comment: (NOTE) The eGFR has been calculated using the CKD EPI equation. This calculation has not been validated in all clinical situations. eGFR's persistently <60 mL/min signify possible Chronic Kidney Disease.    Anion gap 5 5 - 15  CBC     Status: Abnormal   Collection Time: 08/03/15  4:54 AM  Result Value Ref Range   WBC 9.1 4.0 - 10.5 K/uL   RBC 4.30 4.22 - 5.81 MIL/uL   Hemoglobin 13.2 13.0 - 17.0 g/dL   HCT 37.3 (L) 39.0 - 52.0 %   MCV 86.7 78.0 - 100.0 fL   MCH 30.7 26.0 - 34.0 pg   MCHC 35.4 30.0 - 36.0 g/dL   RDW 12.4 11.5 - 15.5 %   Platelets 230 150 - 400 K/uL    Current Facility-Administered Medications  Medication Dose Route Frequency Provider Last Rate Last Dose  . LORazepam (ATIVAN) tablet 0.5 mg  0.5 mg Oral Q6H PRN Corena Pilgrim, MD      .  ondansetron (ZOFRAN) tablet 4 mg  4 mg Oral Q6H PRN Toy Baker, MD       Or  . ondansetron (ZOFRAN) injection 4 mg  4 mg Intravenous Q6H PRN Toy Baker, MD      . sodium chloride flush (NS) 0.9 % injection 3 mL  3 mL Intravenous Q12H Toy Baker, MD   3 mL at 08/02/15 2158    Musculoskeletal: Strength & Muscle Tone: within normal limits Gait & Station: normal Patient leans: N/A  Psychiatric Specialty Exam: Physical Exam  Psychiatric: His speech is normal and behavior is normal. Judgment and thought content normal. His mood appears anxious. Cognition and memory are normal.    Review of Systems  Constitutional: Negative.   HENT: Negative.   Eyes: Negative.   Respiratory: Negative.   Cardiovascular: Negative.   Gastrointestinal: Negative.   Genitourinary: Negative.   Musculoskeletal: Negative.   Skin: Negative.   Neurological: Negative.   Endo/Heme/Allergies: Negative.   Psychiatric/Behavioral: The patient is nervous/anxious and has insomnia.      Blood pressure 136/73, pulse 88, temperature 99.3 F (37.4 C), temperature source Oral, resp. rate 16, height 6' (1.829 m), weight 76.1 kg (167 lb 12.3 oz), SpO2 99 %.Body mass index is 22.75 kg/(m^2).  General Appearance: Casual  Eye Contact:  Good  Speech:  Clear and Coherent  Volume:  Normal  Mood:  Anxious  Affect:  Appropriate  Thought Process:  Coherent  Orientation:  Full (Time, Place, and Person)  Thought Content:  Logical  Suicidal Thoughts:  No  Homicidal Thoughts:  No  Memory:  Immediate;   Good Recent;   Good Remote;   Good  Judgement:  Fair  Insight:  Fair  Psychomotor Activity:  Normal  Concentration:  Concentration: Good and Attention Span: Good  Recall:  Good  Fund of Knowledge:  Good  Language:  Good  Akathisia:  No  Handed:  Right  AIMS (if indicated):     Assets:  Communication Skills Desire for Improvement Physical Health Social Support  ADL's:  Intact  Cognition:  WNL  Sleep:   fair     Treatment Plan Summary: Plan: -Crisis stabilization -Continue Remeron 14m qhs for insomnia/anxiety when patient is medically cleared - Start Lorazepam 0.536mq6 as needed for anxiety.  Disposition: No evidence of imminent risk to self or others at present.   Patient does not meet criteria for psychiatric inpatient admission. [facility]  AkCorena PilgrimMD 08/03/2015 1:59 PM

## 2015-08-03 NOTE — Discharge Summary (Signed)
Bobby Sullivan, is a 25 y.o. male  DOB 11-Jan-1991  MRN [id number].  Admission date:  08/02/2015  Admitting Physician  Therisa Doyne, MD  Discharge Date:  08/03/2015   Primary MD  No PCP Per Patient  Recommendations for primary care physician for things to follow:   Patient will be discharge home with instructions to follow with primary care in 7 days.   Admission Diagnosis  Diphenhydramine overdose, intentional self-harm, initial encounter [facility]) [T45.0X2A]   Discharge Diagnosis  Diphenhydramine overdose, intentional self-harm, initial encounter (HCC) [T45.0X2A]    Principal Problem:   Generalized anxiety disorder Active Problems:   Diphenhydramine overdose of undetermined intent   Diphenhydramine overdose   Sinus tachycardia (HCC)   Seizure disorder (HCC)     Untentional Benadryl overdose Determined not to be suicidal  Past Medical History  Diagnosis Date  . Aspergers' syndrome   . Insomnia   . Anxiety     History reviewed. No pertinent past surgical history.     HPI  from the history and physical done on the day of admission:   This is a 25 year old gentleman who apparently took 30 tablets of 25 mg Benadryl and 16 tablets of Coricidin, apparently as an unintentional overdose. After her mother finding the empty containers, 911 was called. On the initial physical examination his temp was 98.1, heart rate 143, respiratory rate 35 with a blood pressure 137/90. His mucous membranes were dry, his lungs were clear to auscultation bilaterally, heart S1-S2 present, rhythmic, abdomen soft and nontender, extremities no edema, neurologically patient was awake, alert and nonfocal. His sodium was 138, potassium 3.2, creatinine 1.23, white count 10.0, hemoglobin 14.7. His urine screen was positive for benzodiazepines. His EKG was showing sinus tachycardia with a rate of 145 bpm.  Patient was admitted  to hospital with the working diagnosis of unintentional Benadryl and Coricidin overdose complicated by acute kidney injury and sinus tachycardia.       Hospital Course:     1. Cardiovascular. Sinus tachycardia  patient received IV fluids, his heart rate improved down to 79-88. He remained sinus rhythm on telemetry monitor.  2. Pulmonary. No signs of volume overload or pulmonary infection.  3. Nephrology. Acute kidney injury . Kidney function improved, responded well to IV fluids with a discharge creatinine of 1.07 with a potassium 4.0, his serum bicarbonate remained normal.  4. Psych /Neurology. Overdose. Patient remained asymptomatic, patient was seen by psychiatric service who recommend patient to follow-up as an outpatient with his primary care providers. He was determined not to be suicidal and he was determined to be safe to discharge.  Discharge Condition: Stable  Follow UP  Follow-up Information    Follow up with Primary Care  In 1 week.       Consults obtained - Psychiatry  Diet and Activity recommendation: See Discharge Instructions below  Discharge Instructions     Discharge Instructions    Diet - low sodium heart healthy    Complete by:  As directed  Discharge instructions    Complete by:  As directed   Please follow with primary care in am.     Increase activity slowly    Complete by:  As directed              Discharge Medications       Medication List    TAKE these medications        acetaminophen 500 MG tablet  Commonly known as:  TYLENOL  Take 1,000 mg by mouth 2 (two) times daily as needed for moderate pain or headache.     MIRTAZAPINE PO  Take 15 mg by mouth at bedtime.        Major procedures and Radiology Reports - PLEASE review detailed and final reports for all details, in brief -      No results found.  Micro Results     No results found for this or any previous visit (from the past 240 hour(s)).     Today    Subjective    Bobby Sullivan patient is awake and alert, no chest pain or dyspnea. Denies any nausea or vomiting.   Objective   Blood pressure 136/73, pulse 88, temperature 99.3 F (37.4 C), temperature source Oral, resp. rate 16, height 6' (1.829 m), weight 76.1 kg (167 lb 12.3 oz), SpO2 99 %.   Intake/Output Summary (Last 24 hours) at 08/03/15 1353 Last data filed at 08/03/15 1233  Gross per 24 hour  Intake 1862.08 ml  Output    875 ml  Net 987.08 ml    Exam Gen. Awake and alert Oral mucosa moist Chest. Lungs are clear to auscultation bilaterally, heart S1-S2 present rhythmic, no gallops, rubs or murmurs. Abdomen soft nontender Lower extremities  no edema Neuro logically nonfocal   Data Review   CBC w Diff: Lab Results  Component Value Date   WBC 9.1 08/03/2015   HGB 13.2 08/03/2015   HCT 37.3* 08/03/2015   PLT 230 08/03/2015   LYMPHOPCT 20 08/02/2015   MONOPCT 9 08/02/2015   EOSPCT 1 08/02/2015   BASOPCT 0 08/02/2015    CMP: Lab Results  Component Value Date   NA 139 08/03/2015   K 4.0 08/03/2015   CL 108 08/03/2015   CO2 26 08/03/2015   BUN 14 08/03/2015   CREATININE 1.07 08/03/2015   PROT 6.8 08/03/2015   ALBUMIN 4.2 08/03/2015   BILITOT 0.9 08/03/2015   ALKPHOS 36* 08/03/2015   AST 19 08/03/2015   ALT 15* 08/03/2015  .   Total Time in preparing paper work, data evaluation and todays exam - 45 minutes  Coralie KeensMauricio Daniel Maelani Yarbro M.D on 08/03/2015 at 1:53 PM  [facility]   Office  [phone]

## 2016-12-12 ENCOUNTER — Encounter (HOSPITAL_COMMUNITY): Payer: Self-pay

## 2016-12-12 ENCOUNTER — Other Ambulatory Visit: Payer: Self-pay

## 2016-12-12 ENCOUNTER — Emergency Department (HOSPITAL_COMMUNITY)
Admission: EM | Admit: 2016-12-12 | Discharge: 2016-12-13 | Disposition: A | Discharge status: Home / Self Care | Payer: Self-pay | Attending: Emergency Medicine | Admitting: Emergency Medicine

## 2016-12-12 DIAGNOSIS — T50901A Poisoning by unspecified drugs, medicaments and biological substances, accidental (unintentional), initial encounter: Secondary | ICD-10-CM | POA: Insufficient documentation

## 2016-12-12 DIAGNOSIS — F845 Asperger's syndrome: Secondary | ICD-10-CM | POA: Insufficient documentation

## 2016-12-12 DIAGNOSIS — F17210 Nicotine dependence, cigarettes, uncomplicated: Secondary | ICD-10-CM | POA: Insufficient documentation

## 2016-12-12 LAB — COMPREHENSIVE METABOLIC PANEL
ALBUMIN: 5.3 g/dL — AB (ref 3.5–5.0)
ALT: 16 U/L — AB (ref 17–63)
AST: 24 U/L (ref 15–41)
Alkaline Phosphatase: 36 U/L — ABNORMAL LOW (ref 38–126)
Anion gap: 12 (ref 5–15)
BILIRUBIN TOTAL: 0.6 mg/dL (ref 0.3–1.2)
BUN: 17 mg/dL (ref 6–20)
CO2: 25 mmol/L (ref 22–32)
CREATININE: 1.24 mg/dL (ref 0.61–1.24)
Calcium: 9.3 mg/dL (ref 8.9–10.3)
Chloride: 101 mmol/L (ref 101–111)
GFR calc Af Amer: >60 mL/min (ref >60)
GFR calc non Af Amer: >60 mL/min (ref >60)
GLUCOSE: 88 mg/dL (ref 65–99)
POTASSIUM: 3.3 mmol/L — AB (ref 3.5–5.1)
Sodium: 138 mmol/L (ref 135–145)
TOTAL PROTEIN: 7.8 g/dL (ref 6.5–8.1)

## 2016-12-12 LAB — RAPID URINE DRUG SCREEN, HOSP PERFORMED
AMPHETAMINES: NOT DETECTED
BARBITURATES: NOT DETECTED
BENZODIAZEPINES: POSITIVE — AB
Cocaine: NOT DETECTED
Opiates: NOT DETECTED
Tetrahydrocannabinol: NOT DETECTED

## 2016-12-12 LAB — ETHANOL: Alcohol, Ethyl (B): <10 mg/dL (ref <10)

## 2016-12-12 LAB — CBG MONITORING, ED: GLUCOSE-CAPILLARY: 81 mg/dL (ref 65–99)

## 2016-12-12 LAB — CBC
HEMATOCRIT: 42.4 % (ref 39.0–52.0)
Hemoglobin: 15 g/dL (ref 13.0–17.0)
MCH: 32 pg (ref 26.0–34.0)
MCHC: 35.4 g/dL (ref 30.0–36.0)
MCV: 90.4 fL (ref 78.0–100.0)
Platelets: 302 10*3/uL (ref 150–400)
RBC: 4.69 MIL/uL (ref 4.22–5.81)
RDW: 12.9 % (ref 11.5–15.5)
WBC: 13.3 10*3/uL — AB (ref 4.0–10.5)

## 2016-12-12 LAB — SALICYLATE LEVEL: Salicylate Lvl: <7 mg/dL (ref 2.8–30.0)

## 2016-12-12 LAB — ACETAMINOPHEN LEVEL: Acetaminophen (Tylenol), Serum: <10 ug/mL — ABNORMAL LOW (ref 10–30)

## 2016-12-12 MED ORDER — POTASSIUM CHLORIDE CRYS ER 20 MEQ PO TBCR
40.0000 meq | EXTENDED_RELEASE_TABLET | Freq: Once | ORAL | Status: AC
  Administered 2016-12-12: 40 meq via ORAL
  Filled 2016-12-12: qty 2

## 2016-12-12 MED ORDER — SODIUM CHLORIDE 0.9 % IV BOLUS (SEPSIS)
500.0000 mL | Freq: Once | INTRAVENOUS | Status: AC
  Administered 2016-12-12: 500 mL via INTRAVENOUS

## 2016-12-12 MED ORDER — SODIUM CHLORIDE 0.9 % IV BOLUS (SEPSIS)
1000.0000 mL | Freq: Once | INTRAVENOUS | Status: AC
  Administered 2016-12-12: 1000 mL via INTRAVENOUS

## 2016-12-12 NOTE — ED Provider Notes (Signed)
[facility]-EMERGENCY DEPT Provider Note   CSN: [id number] Arrival date & time: 12/12/16  2120     History   Chief Complaint Chief Complaint  Patient presents with  . Ingestion    HPI Bobby Sullivan is a 26 y.o. male.  The history is provided by the patient and medical records. No language interpreter was used.  Ingestion    Bobby Sullivan is a 26 y.o. male  with a PMH of Asperger's, prolonged QT who presents to the Emergency Department for evaluation following overdose just prior to arrival.  Patient states that he took 8-9 Unisom intentionally to try to get high.  He denies self-harm or suicide attempt.  Patient states that his birthday was on [birth year] and he got extra money, therefore he wanted to spend it on getting high.  He lives with his mother who is at bedside.  Patient denies any current complaints.  No chest pain, shortness of breath, abdominal pain, back pain, nausea, vomiting, diarrhea, fever or chills. No HI or auditory/visual hallucinations.  Past Medical History:  Diagnosis Date  . Anxiety   . Aspergers' syndrome   . Insomnia     Patient Active Problem List   Diagnosis Date Noted  . Generalized anxiety disorder 08/03/2015  . Seizure disorder (HCC)   . Leukocytosis 12/16/2014  . Diphenhydramine overdose 12/16/2014  . Sinus tachycardia 12/16/2014  . Prolonged Q-T interval on ECG 12/16/2014  . Insomnia, uncontrolled 08/07/2014  . Diphenhydramine overdose of undetermined intent 08/06/2014  . Asperger's syndrome 08/06/2014  . Adjustment disorder with emotional disturbance 06/15/2013    History reviewed. No pertinent surgical history.     Home Medications    Prior to Admission medications   Medication Sig Start Date End Date Taking? Authorizing Provider  mirtazapine (REMERON) 30 MG tablet Take 30 mg at bedtime as needed by mouth (sleep).   Yes [provider]    Family History Family History  Problem Relation Age of Onset    . Bipolar disorder Mother     Social History Social History   Tobacco Use  . Smoking status: Current Every Day Smoker    Packs/day: 0.10    Types: Cigarettes  Substance Use Topics  . Alcohol use: Yes    Comment: occasional  . Drug use: Yes    Comment: Per triple C, benadryl, duster      Allergies   Patient has no known allergies.   Review of Systems Review of Systems  Psychiatric/Behavioral: Negative for suicidal ideas.  All other systems reviewed and are negative.    Physical Exam Updated Vital Signs BP 136/74   Pulse 94   Temp 98 F (36.7 C) (Oral)   Resp 20   SpO2 100%   Physical Exam  Constitutional: He is oriented to person, place, and time. He appears well-developed and well-nourished. No distress.  HENT:  Head: Normocephalic and atraumatic.  Cardiovascular: Normal heart sounds.  No murmur heard. Tachycardic but regular.  Pulmonary/Chest: Effort normal and breath sounds normal. No respiratory distress.  Abdominal: Soft. He exhibits no distension. There is no tenderness.  Musculoskeletal: Normal range of motion.  Neurological: He is alert and oriented to person, place, and time.  Skin: Skin is warm and dry.  Nursing note and vitals reviewed.    ED Treatments / Results  Labs (all labs ordered are listed, but only abnormal results are displayed) Labs Reviewed  COMPREHENSIVE METABOLIC PANEL - Abnormal; Notable for the following components:  Result Value   Potassium 3.3 (*)    Albumin 5.3 (*)    ALT 16 (*)    Alkaline Phosphatase 36 (*)    All other components within normal limits  ACETAMINOPHEN LEVEL - Abnormal; Notable for the following components:   Acetaminophen (Tylenol), Serum <10 (*)    All other components within normal limits  CBC - Abnormal; Notable for the following components:   WBC 13.3 (*)    All other components within normal limits  RAPID URINE DRUG SCREEN, HOSP PERFORMED - Abnormal; Notable for the following components:    Benzodiazepines POSITIVE (*)    All other components within normal limits  ETHANOL  SALICYLATE LEVEL  MAGNESIUM  CBG MONITORING, ED    EKG  EKG Interpretation None       Radiology No results found.  Procedures Procedures (including critical care time)  Medications Ordered in ED Medications  sodium chloride 0.9 % bolus 1,000 mL (0 mLs Intravenous Stopped 12/12/16 2310)  sodium chloride 0.9 % bolus 500 mL (0 mLs Intravenous Stopped 12/13/16 0106)  potassium chloride SA (K-DUR,KLOR-CON) CR tablet 40 mEq (40 mEq Oral Given 12/12/16 2330)     Initial Impression / Assessment and Plan / ED Course  I have reviewed the triage vital signs and the nursing notes.  Pertinent labs & imaging results that were available during my care of the patient were reviewed by me and considered in my medical decision making (see chart for details).    Bobby Sullivan is a 26 y.o. male who presents to ED for evaluation after taking 8-9 Unisom trying to get high just prior to arrival. Denies SI or intentional self harm. Tachycardic upon evaluation.  Spoke with poison control who recommends EKG and labs. Give IV fluids. Continue close cardiac monitoring of heart rate. Observation for 6 hours or until symptoms completely resolve including tachycardia.  Labs reviewed and reassuring.  Mild hypokalemia which was replenished in the ED today.  EKG reviewed with attending.  Initially prolonged QTC which improved to baseline on repeat EKG. monitored in ED for 6 hours.  Symptoms resolved.  Tachycardia resolved. Patient asymptomatic. Feels comfortable with discharge to home. Again denies SI or intentional self harm. Patient does not appear to be a threat to himself or others. Evaluation does not show pathology that would require ongoing emergent intervention or inpatient treatment. Reasons to return to ER discussed and all questions answered.   Patient discussed with Dr. Rush Landmarkegeler who agrees with treatment plan.    Final Clinical Impressions(s) / ED Diagnoses   Final diagnoses:  Accidental drug overdose, initial encounter    ED Discharge Orders    None       Nichols Corter, Chase PicketJaime Pilcher, PA-C 12/13/16 0327    Tegeler, Canary Brimhristopher J, MD 12/13/16 1050

## 2016-12-12 NOTE — ED Notes (Signed)
Pt has been provided a urinal at this time to provide a urine sample when able to.

## 2016-12-12 NOTE — ED Triage Notes (Addendum)
Pt BIB [facility] from home. Pt reports taking 8-9 Unisom over the span of about 12 hours (last at 2000) to "celebrate his birthday." Pupils dilated and pt is tachycardic. He denies any SI/HI. Pt is autistic and appears to be somewhat manic.   EMS vitals 155/96, Pulse 140, 99%, and 92CBG.

## 2016-12-12 NOTE — ED Notes (Signed)
PA at bedside.

## 2016-12-12 NOTE — ED Notes (Signed)
Pts CBG 81  

## 2016-12-13 LAB — MAGNESIUM: Magnesium: 2.1 mg/dL (ref 1.7–2.4)

## 2016-12-13 NOTE — Discharge Instructions (Signed)
It was my pleasure taking care of you today!   Continue to stay hydrated.   Please follow up with your primary care doctor.   Return to ER for new or worsening symptoms, any additional concerns.

## 2017-01-14 ENCOUNTER — Emergency Department (HOSPITAL_COMMUNITY)
Admission: EM | Admit: 2017-01-14 | Discharge: 2017-01-14 | Disposition: A | Discharge status: Home / Self Care | Payer: Self-pay | Attending: Emergency Medicine | Admitting: Emergency Medicine

## 2017-01-14 DIAGNOSIS — F4329 Adjustment disorder with other symptoms: Secondary | ICD-10-CM | POA: Diagnosis present

## 2017-01-14 DIAGNOSIS — F845 Asperger's syndrome: Secondary | ICD-10-CM | POA: Insufficient documentation

## 2017-01-14 DIAGNOSIS — R45851 Suicidal ideations: Secondary | ICD-10-CM | POA: Insufficient documentation

## 2017-01-14 DIAGNOSIS — R000 Tachycardia, unspecified: Secondary | ICD-10-CM | POA: Insufficient documentation

## 2017-01-14 DIAGNOSIS — F17210 Nicotine dependence, cigarettes, uncomplicated: Secondary | ICD-10-CM | POA: Insufficient documentation

## 2017-01-14 LAB — CBC WITH DIFFERENTIAL/PLATELET
BASOS PCT: 1 %
Basophils Absolute: 0.1 10*3/uL (ref 0.0–0.1)
EOS ABS: 0.4 10*3/uL (ref 0.0–0.7)
Eosinophils Relative: 4 %
HCT: 43.9 % (ref 39.0–52.0)
HEMOGLOBIN: 15.5 g/dL (ref 13.0–17.0)
Lymphocytes Relative: 24 %
Lymphs Abs: 2.4 10*3/uL (ref 0.7–4.0)
MCH: 32 pg (ref 26.0–34.0)
MCHC: 35.3 g/dL (ref 30.0–36.0)
MCV: 90.5 fL (ref 78.0–100.0)
Monocytes Absolute: 1 10*3/uL (ref 0.1–1.0)
Monocytes Relative: 10 %
NEUTROS PCT: 61 %
Neutro Abs: 6.1 10*3/uL (ref 1.7–7.7)
PLATELETS: 256 10*3/uL (ref 150–400)
RBC: 4.85 MIL/uL (ref 4.22–5.81)
RDW: 12.7 % (ref 11.5–15.5)
WBC: 9.9 10*3/uL (ref 4.0–10.5)

## 2017-01-14 LAB — RAPID URINE DRUG SCREEN, HOSP PERFORMED
AMPHETAMINES: NOT DETECTED
Barbiturates: NOT DETECTED
Benzodiazepines: POSITIVE — AB
Cocaine: NOT DETECTED
Opiates: NOT DETECTED
TETRAHYDROCANNABINOL: NOT DETECTED

## 2017-01-14 LAB — COMPREHENSIVE METABOLIC PANEL
ALBUMIN: 5.3 g/dL — AB (ref 3.5–5.0)
ALK PHOS: 40 U/L (ref 38–126)
ALT: 31 U/L (ref 17–63)
ANION GAP: 10 (ref 5–15)
AST: 31 U/L (ref 15–41)
BILIRUBIN TOTAL: 0.8 mg/dL (ref 0.3–1.2)
BUN: 14 mg/dL (ref 6–20)
CALCIUM: 9.5 mg/dL (ref 8.9–10.3)
CO2: 26 mmol/L (ref 22–32)
CREATININE: 1.16 mg/dL (ref 0.61–1.24)
Chloride: 103 mmol/L (ref 101–111)
GFR calc Af Amer: >60 mL/min (ref >60)
GFR calc non Af Amer: >60 mL/min (ref >60)
GLUCOSE: 105 mg/dL — AB (ref 65–99)
Potassium: 3.1 mmol/L — ABNORMAL LOW (ref 3.5–5.1)
Sodium: 139 mmol/L (ref 135–145)
TOTAL PROTEIN: 8.1 g/dL (ref 6.5–8.1)

## 2017-01-14 LAB — SALICYLATE LEVEL: Salicylate Lvl: <7 mg/dL (ref 2.8–30.0)

## 2017-01-14 LAB — ACETAMINOPHEN LEVEL: Acetaminophen (Tylenol), Serum: <10 ug/mL — ABNORMAL LOW (ref 10–30)

## 2017-01-14 LAB — ETHANOL: Alcohol, Ethyl (B): <10 mg/dL (ref <10)

## 2017-01-14 LAB — CBG MONITORING, ED: Glucose-Capillary: 92 mg/dL (ref 65–99)

## 2017-01-14 MED ORDER — SODIUM CHLORIDE 0.9 % IV SOLN
Freq: Once | INTRAVENOUS | Status: AC
  Administered 2017-01-14: 03:00:00 via INTRAVENOUS

## 2017-01-14 NOTE — ED Provider Notes (Signed)
[facility]-EMERGENCY DEPT Provider Note   CSN: [id number] Arrival date & time: 01/14/17  0220     History   Chief Complaint Chief Complaint  Patient presents with  . Drug Overdose    HPI Bobby Sullivan is a 26 y.o. male.  The history is provided by the EMS personnel.  Ingestion  This is a recurrent problem. Episode onset: unknown. The problem occurs constantly. The problem has not changed since onset.Pertinent negatives include no chest pain, no abdominal pain, no headaches and no shortness of breath. Nothing aggravates the symptoms. Nothing relieves the symptoms. He has tried nothing for the symptoms. The treatment provided no relief.  Patient with a h/o of intentional OD on benadryl presents with OD on Unisom (benadryl). Took unisom, will not say what his intent was.    Past Medical History:  Diagnosis Date  . Anxiety   . Aspergers' syndrome   . Insomnia     Patient Active Problem List   Diagnosis Date Noted  . Generalized anxiety disorder 08/03/2015  . Seizure disorder (HCC)   . Leukocytosis 12/16/2014  . Diphenhydramine overdose 12/16/2014  . Sinus tachycardia 12/16/2014  . Prolonged Q-T interval on ECG 12/16/2014  . Insomnia, uncontrolled 08/07/2014  . Diphenhydramine overdose of undetermined intent 08/06/2014  . Asperger's syndrome 08/06/2014  . Adjustment disorder with emotional disturbance 06/15/2013    No past surgical history on file.     Home Medications    Prior to Admission medications   Medication Sig Start Date End Date Taking? Authorizing Provider  mirtazapine (REMERON) 30 MG tablet Take 30 mg at bedtime as needed by mouth (sleep).    [provider]    Family History Family History  Problem Relation Age of Onset  . Bipolar disorder Mother     Social History Social History   Tobacco Use  . Smoking status: Current Every Day Smoker    Packs/day: 0.10    Types: Cigarettes  Substance Use Topics  . Alcohol  use: Yes    Comment: occasional  . Drug use: Yes    Comment: Per triple C, benadryl, duster      Allergies   Patient has no known allergies.   Review of Systems Review of Systems  Constitutional: Negative for fever.  HENT: Negative for dental problem and drooling.   Respiratory: Negative for shortness of breath.   Cardiovascular: Negative for chest pain, palpitations and leg swelling.  Gastrointestinal: Negative for abdominal pain.  Genitourinary: Negative for frequency.  Neurological: Negative for headaches.  Psychiatric/Behavioral: Negative for agitation and confusion.  All other systems reviewed and are negative.    Physical Exam Updated Vital Signs BP 134/62   Pulse 76   Temp 97.8 F (36.6 C) (Oral)   Resp 20   SpO2 99% Comment: Simultaneous filing. User may not have seen previous data.  Physical Exam  Constitutional: He appears well-developed and well-nourished.  HENT:  Head: Normocephalic and atraumatic.  Right Ear: External ear normal.  Left Ear: External ear normal.  Nose: Nose normal.  Mouth/Throat: Oropharynx is clear and moist. No oropharyngeal exudate.  Eyes: Conjunctivae are normal. Pupils are equal, round, and reactive to light.  Dilated but consistent 9 to 7  Neck: Normal range of motion. Neck supple.  Cardiovascular: Regular rhythm and intact distal pulses. Tachycardia present.  Pulmonary/Chest: Effort normal and breath sounds normal.  Abdominal: Soft. Bowel sounds are normal. There is no tenderness.  Musculoskeletal: Normal range of motion.  Neurological: He is alert.  He displays normal reflexes.  Skin: Skin is warm and dry. Capillary refill takes less than 2 seconds. He is not diaphoretic.  Psychiatric: His affect is blunt.     ED Treatments / Results  Labs (all labs ordered are listed, but only abnormal results are displayed)  Results for orders placed or performed during the hospital encounter of 01/14/17  CBC WITH DIFFERENTIAL  Result  Value Ref Range   WBC 9.9 4.0 - 10.5 K/uL   RBC 4.85 4.22 - 5.81 MIL/uL   Hemoglobin 15.5 13.0 - 17.0 g/dL   HCT 16.143.9 09.639.0 - 04.552.0 %   MCV 90.5 78.0 - 100.0 fL   MCH 32.0 26.0 - 34.0 pg   MCHC 35.3 30.0 - 36.0 g/dL   RDW 40.912.7 81.111.5 - 91.415.5 %   Platelets 256 150 - 400 K/uL   Neutrophils Relative % 61 %   Neutro Abs 6.1 1.7 - 7.7 K/uL   Lymphocytes Relative 24 %   Lymphs Abs 2.4 0.7 - 4.0 K/uL   Monocytes Relative 10 %   Monocytes Absolute 1.0 0.1 - 1.0 K/uL   Eosinophils Relative 4 %   Eosinophils Absolute 0.4 0.0 - 0.7 K/uL   Basophils Relative 1 %   Basophils Absolute 0.1 0.0 - 0.1 K/uL  CBG monitoring, ED  Result Value Ref Range   Glucose-Capillary 92 65 - 99 mg/dL   No results found.  EKG  EKG Interpretation  Date/Time:  Friday January 14 2017 02:31:39 EST Ventricular Rate:  132 PR Interval:    QRS Duration: 102 QT Interval:  299 QTC Calculation: 443 R Axis:   61 Text Interpretation:  Sinus tachycardia RSR' in V1 or V2, right VCD or RVH Probable LVH with secondary repol abnrm Confirmed by Nicanor AlconPalumbo, Emmary Culbreath ([id number]) on 01/14/2017 3:22:09 AM       Procedures Procedures (including critical care time)  Medications Ordered in ED Medications  0.9 %  sodium chloride infusion ( Intravenous New Bag/Given 01/14/17 0300)      Final Clinical Impressions(s) / ED Diagnoses  I believe this to be an intentional overdose given his h/o same.  Patient trying to leave.  Was committed by me   Medically cleared by me for psychiatry   Gussie Murton, MD 01/14/17 [phone]

## 2017-01-14 NOTE — BH Assessment (Addendum)
Assessment Note  Bobby Sullivan is an 26 y.o. male who presents to the ED under IVC initiated by the EDP while in triage. Pt reports to this writer he intentionally ingested 3, 50 mg benadryl tablets.  Pt was asked if this was a suicide attempt and the pt denies. Pt stated "I just wanted to sleep." EDP suspects this behavior places pt at risk to himself. Upon further review of the pt's hx, the pt has been seen in the ED multiple times c/o intentional OD on benadryl. Pt denies any recent stressors or changes. Pt does not disclose why he continues to OD on medication aside from "I just wanted to sleep."   Pt cooperative during the assessment. Pt makes appropriate eye contact and is actively engaged with this Clinical research associatewriter during the assessment. Pt states he lives home with his mother. Pt denies HI and AVH to this Clinical research associatewriter. Pt states he is prescribed vistaril that he is not taking. When asked what is the barrier that is prohibiting the pt from taking his medication he stated "I don't know."   Diagnosis: Obsessive-compulsive disorder; Autism spectrum disorder per hx (HF)  Past Medical History:  Past Medical History:  Diagnosis Date  . Anxiety   . Aspergers' syndrome   . Insomnia     No past surgical history on file.  Family History:  Family History  Problem Relation Age of Onset  . Bipolar disorder Mother     Social History:  reports that he has been smoking cigarettes.  He has been smoking about 0.10 packs per day. He does not have any smokeless tobacco history on file. He reports that he drinks alcohol. He reports that he uses drugs.  Additional Social History:  Alcohol / Drug Use Pain Medications: See MAR  Prescriptions: See MAR  Over the Counter: See MAR  History of alcohol / drug use?: No history of alcohol / drug abuse(pt denies use )  CIWA: CIWA-Ar BP: 124/84 Pulse Rate: 95 COWS:    Allergies: No Known Allergies  Home Medications:  (Not in a hospital admission)  OB/GYN Status:  No  LMP for male patient.  General Assessment Data Location of Assessment: [facility] TTS Assessment: In system Is this a Tele or Face-to-Face Assessment?: Face-to-Face Is this an Initial Assessment or a Re-assessment for this encounter?: Initial Assessment Marital status: Single Is patient pregnant?: No Pregnancy Status: No Living Arrangements: Parent Can pt return to current living arrangement?: Yes Admission Status: Involuntary(IVC'd by EDP ) Is patient capable of signing voluntary admission?: No Referral Source: Self/Family/Friend Insurance type: none     Crisis Care Plan Living Arrangements: Parent Name of Psychiatrist: pt denies Name of Therapist: pt denies  Education Status Is patient currently in school?: No Highest grade of school patient has completed: 12th Contact person: self  Risk to self with the past 6 months Suicidal Ideation: Yes-Currently Present(pt denies, EDP and TTS have suspicions) Has patient been a risk to self within the past 6 months prior to admission? : Yes Suicidal Intent: Yes-Currently Present(pt denies, EDP and TTS have suspicions) Has patient had any suicidal intent within the past 6 months prior to admission? : Yes Is patient at risk for suicide?: Yes Suicidal Plan?: Yes-Currently Present Has patient had any suicidal plan within the past 6 months prior to admission? : Yes Specify Current Suicidal Plan: PTA pt reports he intenionally ingested "3" 50 mg benadryl tablets  Access to Means: Yes Specify Access to Suicidal Means: pt has access to benadryl  What has been your use of drugs/alcohol within the last 12 months?: denies use  Previous Attempts/Gestures: Yes How many times?: (per chart pt has had multiple ED visits due to OD ) Triggers for Past Attempts: Unknown Intentional Self Injurious Behavior: None Family Suicide History: No Recent stressful life event(s): Other (Comment)(pt denies recent stressor ) Persecutory voices/beliefs?:  No Depression: No Substance abuse history and/or treatment for substance abuse?: No Suicide prevention information given to non-admitted patients: Not applicable  Risk to Others within the past 6 months Homicidal Ideation: No Does patient have any lifetime risk of violence toward others beyond the six months prior to admission? : No Thoughts of Harm to Others: No Current Homicidal Intent: No Current Homicidal Plan: No Access to Homicidal Means: No History of harm to others?: No Assessment of Violence: None Noted Does patient have access to weapons?: No Criminal Charges Pending?: No Does patient have a court date: No Is patient on probation?: No  Psychosis Hallucinations: None noted Delusions: None noted  Mental Status Report Appearance/Hygiene: Unremarkable Eye Contact: Good Motor Activity: Freedom of movement Speech: Logical/coherent, Soft Level of Consciousness: Quiet/awake Mood: Euthymic Affect: Flat, Constricted Anxiety Level: Minimal Thought Processes: Relevant, Coherent Judgement: Impaired Orientation: Person, Place, Time, Appropriate for developmental age Obsessive Compulsive Thoughts/Behaviors: None  Cognitive Functioning Concentration: Normal Memory: Recent Intact, Remote Impaired IQ: Average Insight: Poor Impulse Control: Poor Appetite: Good Sleep: Decreased Total Hours of Sleep: 6 Vegetative Symptoms: None  ADLScreening [facility] Assessment Services) Patient's cognitive ability adequate to safely complete daily activities?: Yes Patient able to express need for assistance with ADLs?: Yes Independently performs ADLs?: Yes (appropriate for developmental age)  Prior Inpatient Therapy Prior Inpatient Therapy: Yes Prior Therapy Dates: 2000 Prior Therapy Facilty/Provider(s): [facility] Reason for Treatment: unknown  Prior Outpatient Therapy Prior Outpatient Therapy: Yes Prior Therapy Dates: pt unable to recall, states "when I was younger" Prior Therapy  Facilty/Provider(s): unknown Reason for Treatment: unknown Does patient have an ACCT team?: No Does patient have Intensive In-House Services?  : No Does patient have [facility] services? : No Does patient have P[facility] services?: No  ADL Screening (condition at time of admission) Patient's cognitive ability adequate to safely complete daily activities?: Yes Is the patient deaf or have difficulty hearing?: No Does the patient have difficulty seeing, even when wearing glasses/contacts?: No Does the patient have difficulty concentrating, remembering, or making decisions?: No Patient able to express need for assistance with ADLs?: Yes Does the patient have difficulty dressing or bathing?: No Independently performs ADLs?: Yes (appropriate for developmental age) Does the patient have difficulty walking or climbing stairs?: No Weakness of Legs: None Weakness of Arms/Hands: None  Home Assistive Devices/Equipment Home Assistive Devices/Equipment: None    Abuse/Neglect Assessment (Assessment to be complete while patient is alone) Abuse/Neglect Assessment Can Be Completed: Yes Physical Abuse: Denies Verbal Abuse: Denies Sexual Abuse: Denies Exploitation of patient/patient's resources: Denies Self-Neglect: Denies     Merchant navy officer (For Healthcare) Does Patient Have a Medical Advance Directive?: No Would patient like information on creating a medical advance directive?: No - Patient declined    Additional Information 1:1 In Past 12 Months?: No CIRT Risk: No Elopement Risk: No Does patient have medical clearance?: Yes     Disposition: Per Bobby Conn, NP pt is recommended for inpt treatment. EDP Dr. Nicanor Alcon, Bobby Sullivan is aware and in agreement with plan. Disposition Initial Assessment Completed for this Encounter: Yes Disposition of Patient: Inpatient treatment program Type of inpatient treatment program: Adult(per Bobby Conn, NP)  On Site Evaluation by:   Reviewed with Physician:     Bobby Sullivan 01/14/2017 6:33 AM

## 2017-01-14 NOTE — ED Triage Notes (Signed)
Pt BIB [facility]. PT stated that he took 4 unisom tablets. When asked why he took the medication he responded " I wanted to sleep." Pt is CAOx4, HX of autism (high functioning). Mother states that the pt has done this previously. The pt is at baseline and is behaving normally per mother.

## 2017-01-14 NOTE — ED Notes (Addendum)
Pt states that he took medication to help him to sleep and did not mean to OD to try & kill himself. He has been calm, cooperative and sleeping.

## 2017-01-14 NOTE — ED Notes (Signed)
Verlon AuLeslie with poison control called to discuss pt's progress and she felt it safe for pt to be cleared by poison control.

## 2017-01-14 NOTE — [facility] Suicide Risk Assessment (Signed)
Suicide Risk Assessment  Discharge Assessment   [facility]   Principal Problem: Adjustment disorder with emotional disturbance Discharge Diagnoses:  Patient Active Problem List   Diagnosis Date Noted  . Adjustment disorder with emotional disturbance [F43.29] 06/15/2013    Priority: High  . Generalized anxiety disorder [F41.1] 08/03/2015  . Seizure disorder (HCC) [G40.909]   . Leukocytosis [D72.829] 12/16/2014  . Diphenhydramine overdose [T45.0X1A] 12/16/2014  . Sinus tachycardia [R00.0] 12/16/2014  . Prolonged Q-T interval on ECG [R94.31] 12/16/2014  . Insomnia, uncontrolled [G47.00] 08/07/2014  . Diphenhydramine overdose of undetermined intent [T45.0X4A] 08/06/2014  . Asperger's syndrome [F84.5] 08/06/2014    Total Time spent with patient: 45 minutes  Musculoskeletal: Strength & Muscle Tone: within normal limits Gait & Station: normal Patient leans: N/A  Psychiatric Specialty Exam:   Blood pressure 108/61, pulse 82, temperature 97.8 F (36.6 C), temperature source Oral, resp. rate 20, SpO2 97 %.There is no height or weight on file to calculate BMI.  General Appearance: Casual  Eye Contact::  Good  Speech:  Normal Rate409  Volume:  Normal  Mood:  Euthymic  Affect:  Appropriate  Thought Process:  Coherent and Descriptions of Associations: Intact  Orientation:  Full (Time, Place, and Person)  Thought Content:  WDL and Logical  Suicidal Thoughts:  No  Homicidal Thoughts:  No  Memory:  Immediate;   Good Recent;   Good Remote;   Good  Judgement:  Fair  Insight:  Fair  Psychomotor Activity:  Normal  Concentration:  Good  Recall:  Good  Fund of Knowledge:Fair  Language: Good  Akathisia:  No  Handed:  Right  AIMS (if indicated):     Assets:  Housing Leisure Time Physical Health Resilience Social Support  Sleep:     Cognition: WNL  ADL's:  Intact   Mental Status Per Nursing Assessment::   On Admission:   26 yo male who presented to  the ED after taking extra Unisom in an effort to sleep.  When asked why he took the pills, he said it was because the first two did not work.  Adamant that this was not a suicide attempt.  Denies homicidal ideations, hallucinations, or withdrawal symptoms.  Stable for discharge after educating on the proper dosing of medications and the importance of following the directions.  Suggested the medicine be kept by his mother.  Demographic Factors:  Male, Adolescent or young adult and Caucasian  Loss Factors: NA  Historical Factors: NA  Risk Reduction Factors:   Sense of responsibility to family, Living with another person, especially a relative and Positive social support  Continued Clinical Symptoms:  None  Cognitive Features That Contribute To Risk:  None    Suicide Risk:  Minimal: No identifiable suicidal ideation.  Patients presenting with no risk factors but with morbid ruminations; may be classified as minimal risk based on the severity of the depressive symptoms    Plan Of Care/Follow-up recommendations:  Activity:  as tolerated Diet:  heart healthy diet  LORD, JAMISON, NP 01/14/2017, 9:54 AM

## 2017-01-14 NOTE — Discharge Instructions (Signed)
For your mental health needs, you are advised to follow up with [facility].  New and returning patients are seen at their walk-in clinic.  Walk-in hours are Monday - Friday from 8:00 am - 3:00 pm.  Walk-in patients are seen on a first come, first served basis.  Try to arrive as early as possible for he best chance of being seen the same day: ° °     [facility] °     [address] °     [place], [place] [address] °     [phone] °

## 2017-01-14 NOTE — ED Notes (Signed)
Poison control called back and suggested a repeat EKG before discharge. EKG was completed and shown to Nanine MeansJamison Lord, DNP. It was NSR.

## 2017-01-14 NOTE — BH Assessment (Signed)
[facility]  Per Juanetta BeetsJacqueline Norman, DO, this pt does not require psychiatric hospitalization at this time.  Pt presents under IVC initiated by EDP April Palumbo, MD, which Dr Sharma CovertNorman has rescinded.  [facility].  This has been included in pt's discharge instructions.  Pt's nurse, Diane, has been notified.  Doylene Canninghomas Saida Lonon, MA Triage Specialist [phone]

## 2017-01-14 NOTE — ED Notes (Signed)
Pt discharged home. Discharged instructions read to pt who verbalized understanding. All belongings returned to pt who signed for same. Denies SI/HI, is not delusional and not responding to internal stimuli. Escorted pt to the ED exit.    

## 2017-02-12 ENCOUNTER — Emergency Department (HOSPITAL_COMMUNITY): Payer: Self-pay

## 2017-02-12 ENCOUNTER — Encounter (HOSPITAL_COMMUNITY): Payer: Self-pay | Admitting: Emergency Medicine

## 2017-02-12 ENCOUNTER — Inpatient Hospital Stay (HOSPITAL_COMMUNITY)
Admission: EM | Admit: 2017-02-12 | Discharge: 2017-02-14 | DRG: 918 | Disposition: A | Discharge status: Other Acute Inpatient Hospital | Payer: Self-pay | Attending: Internal Medicine | Admitting: Internal Medicine

## 2017-02-12 ENCOUNTER — Other Ambulatory Visit: Payer: Self-pay

## 2017-02-12 DIAGNOSIS — F411 Generalized anxiety disorder: Secondary | ICD-10-CM | POA: Diagnosis present

## 2017-02-12 DIAGNOSIS — F17210 Nicotine dependence, cigarettes, uncomplicated: Secondary | ICD-10-CM | POA: Diagnosis present

## 2017-02-12 DIAGNOSIS — F319 Bipolar disorder, unspecified: Secondary | ICD-10-CM | POA: Diagnosis present

## 2017-02-12 DIAGNOSIS — T450X1A Poisoning by antiallergic and antiemetic drugs, accidental (unintentional), initial encounter: Principal | ICD-10-CM | POA: Diagnosis present

## 2017-02-12 DIAGNOSIS — T450X4A Poisoning by antiallergic and antiemetic drugs, undetermined, initial encounter: Secondary | ICD-10-CM

## 2017-02-12 DIAGNOSIS — Z818 Family history of other mental and behavioral disorders: Secondary | ICD-10-CM

## 2017-02-12 DIAGNOSIS — G40909 Epilepsy, unspecified, not intractable, without status epilepticus: Secondary | ICD-10-CM | POA: Diagnosis present

## 2017-02-12 DIAGNOSIS — R000 Tachycardia, unspecified: Secondary | ICD-10-CM | POA: Diagnosis present

## 2017-02-12 DIAGNOSIS — R402362 Coma scale, best motor response, obeys commands, at arrival to emergency department: Secondary | ICD-10-CM | POA: Diagnosis present

## 2017-02-12 DIAGNOSIS — R402252 Coma scale, best verbal response, oriented, at arrival to emergency department: Secondary | ICD-10-CM | POA: Diagnosis present

## 2017-02-12 DIAGNOSIS — R402142 Coma scale, eyes open, spontaneous, at arrival to emergency department: Secondary | ICD-10-CM | POA: Diagnosis present

## 2017-02-12 DIAGNOSIS — F322 Major depressive disorder, single episode, severe without psychotic features: Secondary | ICD-10-CM | POA: Diagnosis present

## 2017-02-12 DIAGNOSIS — F5104 Psychophysiologic insomnia: Secondary | ICD-10-CM | POA: Diagnosis present

## 2017-02-12 DIAGNOSIS — F845 Asperger's syndrome: Secondary | ICD-10-CM | POA: Diagnosis present

## 2017-02-12 DIAGNOSIS — T50901A Poisoning by unspecified drugs, medicaments and biological substances, accidental (unintentional), initial encounter: Secondary | ICD-10-CM | POA: Diagnosis present

## 2017-02-12 DIAGNOSIS — E876 Hypokalemia: Secondary | ICD-10-CM | POA: Diagnosis present

## 2017-02-12 DIAGNOSIS — R4182 Altered mental status, unspecified: Secondary | ICD-10-CM

## 2017-02-12 LAB — CBC
HEMATOCRIT: 42.8 % (ref 39.0–52.0)
HEMOGLOBIN: 14.9 g/dL (ref 13.0–17.0)
MCH: 31 pg (ref 26.0–34.0)
MCHC: 34.8 g/dL (ref 30.0–36.0)
MCV: 89.2 fL (ref 78.0–100.0)
Platelets: 193 10*3/uL (ref 150–400)
RBC: 4.8 MIL/uL (ref 4.22–5.81)
RDW: 12.2 % (ref 11.5–15.5)
WBC: 6.3 10*3/uL (ref 4.0–10.5)

## 2017-02-12 LAB — COMPREHENSIVE METABOLIC PANEL
ALBUMIN: 4.8 g/dL (ref 3.5–5.0)
ALT: 19 U/L (ref 17–63)
AST: 35 U/L (ref 15–41)
Alkaline Phosphatase: 37 U/L — ABNORMAL LOW (ref 38–126)
Anion gap: 11 (ref 5–15)
BUN: 15 mg/dL (ref 6–20)
CALCIUM: 9.3 mg/dL (ref 8.9–10.3)
CHLORIDE: 100 mmol/L — AB (ref 101–111)
CO2: 26 mmol/L (ref 22–32)
CREATININE: 1.13 mg/dL (ref 0.61–1.24)
GFR calc non Af Amer: >60 mL/min (ref >60)
GLUCOSE: 106 mg/dL — AB (ref 65–99)
Potassium: 3.1 mmol/L — ABNORMAL LOW (ref 3.5–5.1)
SODIUM: 137 mmol/L (ref 135–145)
Total Bilirubin: 0.6 mg/dL (ref 0.3–1.2)
Total Protein: 8 g/dL (ref 6.5–8.1)

## 2017-02-12 LAB — CBG MONITORING, ED: GLUCOSE-CAPILLARY: 94 mg/dL (ref 65–99)

## 2017-02-12 LAB — SALICYLATE LEVEL

## 2017-02-12 LAB — ACETAMINOPHEN LEVEL

## 2017-02-12 LAB — ETHANOL: Alcohol, Ethyl (B): <10 mg/dL (ref <10)

## 2017-02-12 LAB — I-STAT TROPONIN, ED: TROPONIN I, POC: 0 ng/mL (ref 0.00–0.08)

## 2017-02-12 MED ORDER — SODIUM CHLORIDE 0.9 % IV BOLUS (SEPSIS)
1000.0000 mL | Freq: Once | INTRAVENOUS | Status: AC
  Administered 2017-02-12: 1000 mL via INTRAVENOUS

## 2017-02-12 MED ORDER — POTASSIUM CHLORIDE CRYS ER 20 MEQ PO TBCR
20.0000 meq | EXTENDED_RELEASE_TABLET | Freq: Once | ORAL | Status: DC
  Filled 2017-02-12: qty 1

## 2017-02-12 MED ORDER — POTASSIUM CHLORIDE 10 MEQ/100ML IV SOLN
10.0000 meq | INTRAVENOUS | Status: AC
  Administered 2017-02-12 – 2017-02-13 (×2): 10 meq via INTRAVENOUS
  Filled 2017-02-12 (×2): qty 100

## 2017-02-12 MED ORDER — POTASSIUM CHLORIDE CRYS ER 20 MEQ PO TBCR
40.0000 meq | EXTENDED_RELEASE_TABLET | Freq: Once | ORAL | Status: AC
  Administered 2017-02-12: 40 meq via ORAL
  Filled 2017-02-12: qty 2

## 2017-02-12 NOTE — ED Notes (Signed)
Bed: [id number] Expected date:  Expected time:  Means of arrival:  Comments: 27 yo M/ poss od

## 2017-02-12 NOTE — ED Triage Notes (Signed)
Pt brought in by EMS from home with c/o possible "Unisom" overdose.  Pt was observed by pt's mother after coming home from dinner to be "extremely restless and fidgety and not talking".  Pt has autism but highly functioning, was an Occupational hygienistex-military.  Pt on Unisom--- hx of overdosing unisom or benadryl.

## 2017-02-12 NOTE — ED Provider Notes (Signed)
[facility]-EMERGENCY DEPT Provider Note   CSN: [id number] Arrival date & time: 02/12/17  2043     History   Chief Complaint Chief Complaint  Patient presents with  . Possibble Overdose    HPI Bobby Sullivan is a 27 y.o. male.  HPI   On evaluation, patient is not speaking, and unable to provide any kind of history.  Patient is still following instructions, but not verbalizing or answering any questions.  Collateral information obtained from his mother, Claris GowerRhonda Rosser who reports that patient routinely overdose is on diphenhydramine, however this particular episode appeared worse.  Patient's mother reports that "I feel that he has given up on life".  Patient's mother reports that he was invited to go out with friends this evening prior to taking the medications.  Patient mother reports that this evening, he "collapsed", and appeared to be less interactive with her.  Patient reports that subsequently, he was more agitated.  Patient's mother reports that patient has had a poor experience of psychiatric admission.  Patient's mother reports that he has never explicitly stated SI.  Patient's mother reports that [facility], however patient did not feel this was a good fit, and did not continue services.  Patient's mother reports that he is significantly anxious at baseline, and has been prescribed hydroxyzine in the past.  No other psychiatric medications at this time.  Patient works as a Nutritional therapistplumber.  Level 5 caveat altered mental status.  Past Medical History:  Diagnosis Date  . Anxiety   . Aspergers' syndrome   . Insomnia     Patient Active Problem List   Diagnosis Date Noted  . Generalized anxiety disorder 08/03/2015  . Seizure disorder (HCC)   . Leukocytosis 12/16/2014  . Diphenhydramine overdose 12/16/2014  . Sinus tachycardia 12/16/2014  . Prolonged Q-T interval on ECG 12/16/2014  . Insomnia, uncontrolled 08/07/2014  . Diphenhydramine  overdose of undetermined intent 08/06/2014  . Asperger's syndrome 08/06/2014  . Adjustment disorder with emotional disturbance 06/15/2013    History reviewed. No pertinent surgical history.     Home Medications    Prior to Admission medications   Not on File    Family History Family History  Problem Relation Age of Onset  . Bipolar disorder Mother     Social History Social History   Tobacco Use  . Smoking status: Current Every Day Smoker    Packs/day: 0.10    Types: Cigarettes  . Smokeless tobacco: Never Used  Substance Use Topics  . Alcohol use: Yes    Comment: occasional  . Drug use: Yes    Comment: Per [facility], benadryl, duster      Allergies   Patient has no known allergies.   Review of Systems Review of Systems  Respiratory: Negative for shortness of breath.   Cardiovascular: Negative for chest pain.  Gastrointestinal: Negative for abdominal pain, nausea and vomiting.  Neurological: Negative for headaches.  Psychiatric/Behavioral: Negative for agitation and confusion. The patient is nervous/anxious.     Level 5 caveat altered mental status. Physical Exam Updated Vital Signs BP (!) 139/95   Pulse (!) 139   Temp 98.3 F (36.8 C) (Oral)   Resp 18   SpO2 100%   Physical Exam  Constitutional: He appears well-developed and well-nourished. No distress.  HENT:  Head: Normocephalic and atraumatic.  Mouth/Throat: Oropharynx is clear and moist.  Eyes: Conjunctivae and EOM are normal. Pupils are equal, round, and reactive to light.  Pupils  are dilated to approximately 7 mm and equal.  Neck: Normal range of motion. Neck supple.  Cardiovascular: Regular rhythm, S1 normal and S2 normal.  No murmur heard. Patient is tachycardic.  Pulmonary/Chest: Effort normal and breath sounds normal. He has no wheezes. He has no rales.  Abdominal: Soft. He exhibits no distension. There is no tenderness. There is no guarding.  Musculoskeletal: Normal range of motion.  He exhibits no edema or deformity.  Lymphadenopathy:    He has no cervical adenopathy.  Neurological: He is alert. GCS eye subscore is 4. GCS verbal subscore is 5. GCS motor subscore is 6.  Mental Status:  Alert, oriented, thought content appropriate, able to give a coherent history. Speech fluent without evidence of aphasia. Able to follow 2 step commands without difficulty.  Cranial Nerves:  II:  Peripheral visual fields grossly normal, pupils equal, round, reactive to light III,IV, VI: ptosis not present, extra-ocular motions intact bilaterally  V,VII: smile symmetric, facial light touch sensation equal VIII: hearing grossly normal to voice  X: uvula elevates symmetrically  XI: bilateral shoulder shrug symmetric and strong XII: midline tongue extension without fassiculations Motor:  Normal tone. 5/5 in upper and lower extremities bilaterally including strong and equal grip strength and dorsiflexion/plantar flexion Sensory: Pinprick and light touch normal in all extremities.  Deep Tendon Reflexes: 2+ patella. No clonus. Cerebellar: normal finger-to-nose with bilateral upper extremities Gait: normal gait and balance Stance: No pronator drift and good coordination, strength, and position sense with tapping of bilateral arms (performed in sitting position). CV: distal pulses palpable throughout   Skin: Skin is warm and dry. No rash noted. No erythema.  Psychiatric: He has a normal mood and affect. His behavior is normal. Judgment and thought content normal.  Nursing note and vitals reviewed.    ED Treatments / Results  Labs (all labs ordered are listed, but only abnormal results are displayed) Labs Reviewed  COMPREHENSIVE METABOLIC PANEL  ETHANOL  SALICYLATE LEVEL  ACETAMINOPHEN LEVEL  CBC  RAPID URINE DRUG SCREEN, HOSP PERFORMED  CBG MONITORING, ED    EKG  EKG Interpretation None       Radiology No results found.  Procedures Procedures (including critical care  time)  Medications Ordered in ED Medications - No data to display   Initial Impression / Assessment and Plan / ED Course  I have reviewed the triage vital signs and the nursing notes.  Pertinent labs & imaging results that were available during my care of the patient were reviewed by me and considered in my medical decision making (see chart for details).      Final Clinical Impressions(s) / ED Diagnoses   Final diagnoses:  Diphenhydramine overdose of undetermined intent, initial encounter  Altered mental status, unspecified altered mental status type  Tachycardia   Due to patient's inability to interact on initial evaluation, and concern from collateral information from patient's mother regarding the intent of the overdose, patient will be IVC'ed.  Patient is persistently tachycardic with a QTC of 536.  Potassium is 3.1.  Will replete oral and IV.  Patient given 2 L of normal saline with additional third ordered.  CT scan of the head without contrast demonstrates no acute intracranial abnormality.  Patient does not have salicylate or acetaminophen levels.  Will obtain magnesium to further evaluate QTC prolongation.  Spoke with Revonda Standard and poison control.  Per poison control consult, as patient has a prolonged QTC, patient to receive further EKG reevaluation in 2-3 hours.  Patient's QRS duration  currently 103.  Should this elongate to 120-140, should patient's QRS be a long gated significantly from previous EKG evaluation, patient require 1 or 2 amps of bicarb.   On further evaluation, patient was conversive, interactive and able to provide more information.  Patient neurologically intact and GCS 15 on secondary evaluation after 2 L of normal saline.  Patient reports that he was trying to sleep and was not intending to harm himself.  Patient denies suicidal ideation at this time.  Patient also reporting that he took some Tylenol, 2 pills. Will order 4-hour Tylenol level to follow.  [facility]  reports he does not wish to be psychiatrically admitted.  Patient informed that he will require observation overnight.  Will consult hospital medicine.  Spoke with Dr. Mikeal Hawthorne of [facility] hospitalist, who will admit the patient for observation and medical clearance prior to evaluation by psychiatry.  12:56 AM Care signed out to [facility], PA-C.  ED Discharge Orders    None       Delia Chimes 02/13/17 0058    Elisha Ponder, PA-C 02/13/17 0128    Rolan Bucco, MD 02/13/17 [id number]

## 2017-02-13 ENCOUNTER — Other Ambulatory Visit: Payer: Self-pay

## 2017-02-13 DIAGNOSIS — T50901A Poisoning by unspecified drugs, medicaments and biological substances, accidental (unintentional), initial encounter: Secondary | ICD-10-CM | POA: Diagnosis present

## 2017-02-13 DIAGNOSIS — Z818 Family history of other mental and behavioral disorders: Secondary | ICD-10-CM

## 2017-02-13 DIAGNOSIS — T450X2A Poisoning by antiallergic and antiemetic drugs, intentional self-harm, initial encounter: Secondary | ICD-10-CM

## 2017-02-13 DIAGNOSIS — F1099 Alcohol use, unspecified with unspecified alcohol-induced disorder: Secondary | ICD-10-CM

## 2017-02-13 DIAGNOSIS — T1491XA Suicide attempt, initial encounter: Secondary | ICD-10-CM

## 2017-02-13 DIAGNOSIS — F1990 Other psychoactive substance use, unspecified, uncomplicated: Secondary | ICD-10-CM

## 2017-02-13 DIAGNOSIS — R4587 Impulsiveness: Secondary | ICD-10-CM

## 2017-02-13 DIAGNOSIS — F17210 Nicotine dependence, cigarettes, uncomplicated: Secondary | ICD-10-CM

## 2017-02-13 DIAGNOSIS — F332 Major depressive disorder, recurrent severe without psychotic features: Secondary | ICD-10-CM

## 2017-02-13 DIAGNOSIS — I4581 Long QT syndrome: Secondary | ICD-10-CM

## 2017-02-13 DIAGNOSIS — F5104 Psychophysiologic insomnia: Secondary | ICD-10-CM

## 2017-02-13 DIAGNOSIS — F411 Generalized anxiety disorder: Secondary | ICD-10-CM

## 2017-02-13 DIAGNOSIS — F322 Major depressive disorder, single episode, severe without psychotic features: Secondary | ICD-10-CM

## 2017-02-13 LAB — RAPID URINE DRUG SCREEN, HOSP PERFORMED
AMPHETAMINES: NOT DETECTED
BARBITURATES: NOT DETECTED
BENZODIAZEPINES: NOT DETECTED
Cocaine: NOT DETECTED
Opiates: NOT DETECTED
Tetrahydrocannabinol: NOT DETECTED

## 2017-02-13 LAB — COMPREHENSIVE METABOLIC PANEL
ALBUMIN: 3.9 g/dL (ref 3.5–5.0)
ALK PHOS: 27 U/L — AB (ref 38–126)
ALT: 15 U/L — AB (ref 17–63)
ANION GAP: 5 (ref 5–15)
AST: 23 U/L (ref 15–41)
BILIRUBIN TOTAL: 0.4 mg/dL (ref 0.3–1.2)
BUN: 12 mg/dL (ref 6–20)
CALCIUM: 8.1 mg/dL — AB (ref 8.9–10.3)
CO2: 27 mmol/L (ref 22–32)
Chloride: 107 mmol/L (ref 101–111)
Creatinine, Ser: 1.08 mg/dL (ref 0.61–1.24)
GFR calc Af Amer: >60 mL/min (ref >60)
GFR calc non Af Amer: >60 mL/min (ref >60)
GLUCOSE: 88 mg/dL (ref 65–99)
Potassium: 3.6 mmol/L (ref 3.5–5.1)
SODIUM: 139 mmol/L (ref 135–145)
Total Protein: 6.2 g/dL — ABNORMAL LOW (ref 6.5–8.1)

## 2017-02-13 LAB — CBC
HCT: 36.7 % — ABNORMAL LOW (ref 39.0–52.0)
HEMOGLOBIN: 12.3 g/dL — AB (ref 13.0–17.0)
MCH: 30.4 pg (ref 26.0–34.0)
MCHC: 33.5 g/dL (ref 30.0–36.0)
MCV: 90.6 fL (ref 78.0–100.0)
Platelets: 185 10*3/uL (ref 150–400)
RBC: 4.05 MIL/uL — ABNORMAL LOW (ref 4.22–5.81)
RDW: 12.5 % (ref 11.5–15.5)
WBC: 5.5 10*3/uL (ref 4.0–10.5)

## 2017-02-13 LAB — ACETAMINOPHEN LEVEL: Acetaminophen (Tylenol), Serum: <10 ug/mL — ABNORMAL LOW (ref 10–30)

## 2017-02-13 LAB — HIV ANTIBODY (ROUTINE TESTING W REFLEX): HIV Screen 4th Generation wRfx: NONREACTIVE

## 2017-02-13 LAB — MAGNESIUM: Magnesium: 1.7 mg/dL (ref 1.7–2.4)

## 2017-02-13 MED ORDER — POTASSIUM CHLORIDE IN NACL 20-0.9 MEQ/L-% IV SOLN
INTRAVENOUS | Status: DC
  Administered 2017-02-13 – 2017-02-14 (×4): via INTRAVENOUS
  Filled 2017-02-13 (×4): qty 1000

## 2017-02-13 NOTE — Consult Note (Signed)
[facility] Consult   Reason for Consult:  Drug overdose Referring Physician:  Dr. Jonelle Sidle Patient Identification: Bobby Sullivan MRN:  [id number] Principal Diagnosis: Major depressive disorder, single episode, severe ([place]) Diagnosis:   Patient Active Problem List   Diagnosis Date Noted  . Major depressive disorder, single episode, severe ([place]) [F32.2] 02/13/2017    Priority: High  . Generalized anxiety disorder [F41.1] 08/03/2015    Priority: High  . Diphenhydramine overdose [T45.0X1A] 12/16/2014    Priority: High  . Overdose [T50.901A] 02/13/2017  . Seizure disorder ([place]) [M27.078]   . Leukocytosis [D72.829] 12/16/2014  . Sinus tachycardia [R00.0] 12/16/2014  . Prolonged Q-T interval on ECG [R94.31] 12/16/2014  . Insomnia, uncontrolled [G47.00] 08/07/2014  . Diphenhydramine overdose of undetermined intent [T45.0X4A] 08/06/2014  . Asperger's syndrome [F84.5] 08/06/2014  . Adjustment disorder with emotional disturbance [F43.29] 06/15/2013    Total Time spent with patient: 45 minutes  Subjective:   Bobby Sullivan is a 27 y.o. male patient admitted after he overdosed on Benadryl.  HPI:  Patient with history of Major depression, Generalized anxiety disorder and Chronic insomnia who was brought to the [facility] for evaluation after he overdosed on Benadryl. Collateral information obtained from patient's mother revealed that patient has overdosed on Benadryl multiple times in the past, most recent incident about a month ago, December, 2018 but she states that this particular episode appeared worse. Patient states that he has difficulty sleeping and has been recieving Remeron from a doctor in [place] for sleep, depression and anxiety. Mother reports that "I feel that he has given up on life". Patient was lethargic when he presented to the hospital but he is now alert and awake. He appear anxious, worries, apprehensive and depressed. He does not wants to be admitted to psychiatric  hospital but giving his multiple accidental overdose back to back I will recommend inpatient psychiatric  admission for stabilization.  Past Psychiatric History: as above  Risk to Self: Is patient at risk for suicide?: Yes Risk to Others:   Prior Inpatient Therapy:   Prior Outpatient Therapy:    Past Medical History:  Past Medical History:  Diagnosis Date  . Anxiety   . Aspergers' syndrome   . Insomnia    History reviewed. No pertinent surgical history. Family History:  Family History  Problem Relation Age of Onset  . Bipolar disorder Mother    Family Psychiatric  History:  Social History:  Social History   Substance and Sexual Activity  Alcohol Use Yes   Comment: occasional     Social History   Substance and Sexual Activity  Drug Use Yes   Comment: Per triple C, benadryl, duster     Social History   Socioeconomic History  . Marital status: Married    Spouse name: None  . Number of children: None  . Years of education: None  . Highest education level: None  Social Needs  . Financial resource strain: None  . Food insecurity - worry: None  . Food insecurity - inability: None  . Transportation needs - medical: None  . Transportation needs - non-medical: None  Occupational History  . None  Tobacco Use  . Smoking status: Current Every Day Smoker    Packs/day: 0.10    Types: Cigarettes  . Smokeless tobacco: Never Used  Substance and Sexual Activity  . Alcohol use: Yes    Comment: occasional  . Drug use: Yes    Comment: Per triple C, benadryl, duster   . Sexual activity: None  Other Topics Concern  . None  Social History Narrative  . None   Additional Social History:    Allergies:  No Known Allergies  Labs:  Results for orders placed or performed during the hospital encounter of 02/12/17 (from the past 48 hour(s))  Comprehensive metabolic panel     Status: Abnormal   Collection Time: 02/12/17  9:15 PM  Result Value Ref Range   Sodium 137 135 - 145  mmol/L   Potassium 3.1 (L) 3.5 - 5.1 mmol/L   Chloride 100 (L) 101 - 111 mmol/L   CO2 26 22 - 32 mmol/L   Glucose, Bld 106 (H) 65 - 99 mg/dL   BUN 15 6 - 20 mg/dL   Creatinine, Ser 1.13 0.61 - 1.24 mg/dL   Calcium 9.3 8.9 - 10.3 mg/dL   Total Protein 8.0 6.5 - 8.1 g/dL   Albumin 4.8 3.5 - 5.0 g/dL   AST 35 15 - 41 U/L   ALT 19 17 - 63 U/L   Alkaline Phosphatase 37 (L) 38 - 126 U/L   Total Bilirubin 0.6 0.3 - 1.2 mg/dL   GFR calc non Af Amer >60 >60 mL/min   GFR calc Af Amer >60 >60 mL/min    Comment: (NOTE) The eGFR has been calculated using the CKD EPI equation. This calculation has not been validated in all clinical situations. eGFR's persistently <60 mL/min signify possible Chronic Kidney Disease.    Anion gap 11 5 - 15  Ethanol     Status: None   Collection Time: 02/12/17  9:15 PM  Result Value Ref Range   Alcohol, Ethyl (B) <10 <10 mg/dL    Comment:        LOWEST DETECTABLE LIMIT FOR SERUM ALCOHOL IS 10 mg/dL FOR MEDICAL PURPOSES ONLY   Salicylate level     Status: None   Collection Time: 02/12/17  9:15 PM  Result Value Ref Range   Salicylate Lvl <3.6 2.8 - 30.0 mg/dL  Acetaminophen level     Status: Abnormal   Collection Time: 02/12/17  9:15 PM  Result Value Ref Range   Acetaminophen (Tylenol), Serum <10 (L) 10 - 30 ug/mL    Comment:        THERAPEUTIC CONCENTRATIONS VARY SIGNIFICANTLY. A RANGE OF 10-30 ug/mL MAY BE AN EFFECTIVE CONCENTRATION FOR MANY PATIENTS. HOWEVER, SOME ARE BEST TREATED AT CONCENTRATIONS OUTSIDE THIS RANGE. ACETAMINOPHEN CONCENTRATIONS >150 ug/mL AT 4 HOURS AFTER INGESTION AND >50 ug/mL AT 12 HOURS AFTER INGESTION ARE OFTEN ASSOCIATED WITH TOXIC REACTIONS.   cbc     Status: None   Collection Time: 02/12/17  9:15 PM  Result Value Ref Range   WBC 6.3 4.0 - 10.5 K/uL   RBC 4.80 4.22 - 5.81 MIL/uL   Hemoglobin 14.9 13.0 - 17.0 g/dL   HCT 42.8 39.0 - 52.0 %   MCV 89.2 78.0 - 100.0 fL   MCH 31.0 26.0 - 34.0 pg   MCHC 34.8 30.0 -  36.0 g/dL   RDW 12.2 11.5 - 15.5 %   Platelets 193 150 - 400 K/uL  CBG monitoring, ED     Status: None   Collection Time: 02/12/17  9:27 PM  Result Value Ref Range   Glucose-Capillary 94 65 - 99 mg/dL  I-Stat Troponin, ED (not at [facility])     Status: None   Collection Time: 02/12/17 10:17 PM  Result Value Ref Range   Troponin i, poc 0.00 0.00 - 0.08 ng/mL   Comment 3  Comment: Due to the release kinetics of cTnI, a negative result within the first hours of the onset of symptoms does not rule out myocardial infarction with certainty. If myocardial infarction is still suspected, repeat the test at appropriate intervals.   Rapid urine drug screen (hospital performed)     Status: None   Collection Time: 02/13/17 12:18 AM  Result Value Ref Range   Opiates NONE DETECTED NONE DETECTED   Cocaine NONE DETECTED NONE DETECTED   Benzodiazepines NONE DETECTED NONE DETECTED   Amphetamines NONE DETECTED NONE DETECTED   Tetrahydrocannabinol NONE DETECTED NONE DETECTED   Barbiturates NONE DETECTED NONE DETECTED    Comment: (NOTE) DRUG SCREEN FOR MEDICAL PURPOSES ONLY.  IF CONFIRMATION IS NEEDED FOR ANY PURPOSE, NOTIFY LAB WITHIN 5 DAYS. LOWEST DETECTABLE LIMITS FOR URINE DRUG SCREEN Drug Class                     Cutoff (ng/mL) Amphetamine and metabolites    1000 Barbiturate and metabolites    200 Benzodiazepine                 768 Tricyclics and metabolites     300 Opiates and metabolites        300 Cocaine and metabolites        300 THC                            50   Magnesium     Status: None   Collection Time: 02/13/17 12:31 AM  Result Value Ref Range   Magnesium 1.7 1.7 - 2.4 mg/dL  Acetaminophen level     Status: Abnormal   Collection Time: 02/13/17 12:31 AM  Result Value Ref Range   Acetaminophen (Tylenol), Serum <10 (L) 10 - 30 ug/mL    Comment:        THERAPEUTIC CONCENTRATIONS VARY SIGNIFICANTLY. A RANGE OF 10-30 ug/mL MAY BE AN EFFECTIVE CONCENTRATION FOR MANY  PATIENTS. HOWEVER, SOME ARE BEST TREATED AT CONCENTRATIONS OUTSIDE THIS RANGE. ACETAMINOPHEN CONCENTRATIONS >150 ug/mL AT 4 HOURS AFTER INGESTION AND >50 ug/mL AT 12 HOURS AFTER INGESTION ARE OFTEN ASSOCIATED WITH TOXIC REACTIONS.   Comprehensive metabolic panel     Status: Abnormal   Collection Time: 02/13/17  5:25 AM  Result Value Ref Range   Sodium 139 135 - 145 mmol/L   Potassium 3.6 3.5 - 5.1 mmol/L   Chloride 107 101 - 111 mmol/L   CO2 27 22 - 32 mmol/L   Glucose, Bld 88 65 - 99 mg/dL   BUN 12 6 - 20 mg/dL   Creatinine, Ser 1.08 0.61 - 1.24 mg/dL   Calcium 8.1 (L) 8.9 - 10.3 mg/dL   Total Protein 6.2 (L) 6.5 - 8.1 g/dL   Albumin 3.9 3.5 - 5.0 g/dL   AST 23 15 - 41 U/L   ALT 15 (L) 17 - 63 U/L   Alkaline Phosphatase 27 (L) 38 - 126 U/L   Total Bilirubin 0.4 0.3 - 1.2 mg/dL   GFR calc non Af Amer >60 >60 mL/min   GFR calc Af Amer >60 >60 mL/min    Comment: (NOTE) The eGFR has been calculated using the CKD EPI equation. This calculation has not been validated in all clinical situations. eGFR's persistently <60 mL/min signify possible Chronic Kidney Disease.    Anion gap 5 5 - 15  CBC     Status: Abnormal   Collection Time: 02/13/17  5:25 AM  Result Value Ref Range   WBC 5.5 4.0 - 10.5 K/uL   RBC 4.05 (L) 4.22 - 5.81 MIL/uL   Hemoglobin 12.3 (L) 13.0 - 17.0 g/dL   HCT 36.7 (L) 39.0 - 52.0 %   MCV 90.6 78.0 - 100.0 fL   MCH 30.4 26.0 - 34.0 pg   MCHC 33.5 30.0 - 36.0 g/dL   RDW 12.5 11.5 - 15.5 %   Platelets 185 150 - 400 K/uL    Current Facility-Administered Medications  Medication Dose Route Frequency Provider Last Rate Last Dose  . 0.9 % NaCl with KCl 20 mEq/ L  infusion   Intravenous Continuous Elwyn Reach, MD 100 mL/hr at 02/13/17 1250     Current Outpatient Medications  Medication Sig Dispense Refill  . doxylamine, Sleep, (UNISOM) 25 MG tablet Take 50 mg by mouth at bedtime as needed for sleep.      Musculoskeletal: Strength & Muscle Tone:  within normal limits Gait & Station: normal Patient leans: N/A  Psychiatric Specialty Exam: Physical Exam  Psychiatric: His speech is normal and behavior is normal. His mood appears anxious. Cognition and memory are normal. He expresses impulsivity. He exhibits a depressed mood. He expresses suicidal ideation.    Review of Systems  Constitutional: Negative.   HENT: Negative.   Eyes: Negative.   Respiratory: Negative.   Cardiovascular: Negative.   Gastrointestinal: Negative.   Genitourinary: Negative.   Skin: Negative.   Neurological: Negative.   Endo/Heme/Allergies: Negative.   Psychiatric/Behavioral: Positive for depression and suicidal ideas.    Blood pressure 131/75, pulse 95, temperature 98.3 F (36.8 C), temperature source Oral, resp. rate 14, SpO2 100 %.There is no height or weight on file to calculate BMI.  General Appearance: Casual  Eye Contact:  Minimal  Speech:  Clear and Coherent and Slow  Volume:  Decreased  Mood:  Depressed and Dysphoric  Affect:  Constricted  Thought Process:  Coherent  Orientation:  Full (Time, Place, and Person)  Thought Content:  Logical  Suicidal Thoughts:  Yes.  without intent/plan  Homicidal Thoughts:  No  Memory:  Immediate;   Fair Recent;   Fair Remote;   Fair  Judgement:  Poor  Insight:  Shallow  Psychomotor Activity:  Psychomotor Retardation  Concentration:  Concentration: Fair and Attention Span: Fair  Recall:  [employer] of Knowledge:  Fair  Language:  Fair  Akathisia:  No  Handed:  Right  AIMS (if indicated):     Assets:  Armed forces logistics/support/administrative officer Social Support  ADL's:  Intact  Cognition:  WNL  Sleep:   poor     Treatment Plan Summary: Daily contact with patient to assess and evaluate symptoms and progress in treatment and Medication management Hold all psychiatric medications due to significant tachycardia and prolong QT  Disposition:  Recommend psychiatric Inpatient admission when medically cleared. Supportive  therapy provided about ongoing stressors.  Corena Pilgrim, MD 02/13/2017 11:11 AM

## 2017-02-13 NOTE — ED Notes (Signed)
Pt requests he have NO visitors.

## 2017-02-13 NOTE — H&P (Signed)
History and Physical    Bobby CaterJoseph Cranfield ZOX:[id number] DOB: 06/14/1990 DOA: 02/12/2017  Referring MD/NP/PA: Aviva KluverMurray, Alyssa, PA PCP: Patient, No Pcp Per   Outpatient Specialists: None Patient coming from: Home  Chief Complaint: Possible overdose  HPI: Bobby Sullivan is a 27 y.o. male with medical history significant of recurrent overdose of medications with multiple admissions who was brought in by his mother after overdose with Benadryl. At the time of arrival in the ER patient was apparently obtunded not verbalizing. Report was therefore obtained from his mother. He was involuntarily committed at that point with presumed drug overdose with possible suicidal ideation. Patient is currently awake and alert and denies any suicidal ideation. He was noted to have significant tachycardia as well as prolonged QTC. Patient also has complained of hypokalemia which has been replaced. Medical admission is required for medical clearance prior to psychiatric evaluation and disposition. He denied any new complaint at this point. He is fully awake and alert at this point. Patient denies being depressed. He was just trying to sleep when he took the medications. He has history of bipolar disorder.   Patient's mother reports that he has never explicitly stated SI.  Patient's mother reports that he has had some follow-up at [facility], however patient did not feel this was a good fit, and did not continue services.  Patient's mother reports that he is significantly anxious at baseline, and has been prescribed hydroxyzine in the past.  No other psychiatric medications at this time.  Patient works as a Nutritional therapistplumber    ED Course: Patient has received potassium in the ER and his potassium was 3.1 and was replaced. Initial EKG has QTC of 5:30 6 repeat EKG is 465.  Review of Systems: As per HPI otherwise 10 point review of systems negative.    Past Medical History:  Diagnosis Date  . Anxiety   . Aspergers' syndrome   .  Insomnia     History reviewed. No pertinent surgical history.   reports that he has been smoking cigarettes.  He has been smoking about 0.10 packs per day. he has never used smokeless tobacco. He reports that he drinks alcohol. He reports that he uses drugs.  No Known Allergies  Family History  Problem Relation Age of Onset  . Bipolar disorder Mother      Prior to Admission medications   Medication Sig Start Date End Date Taking? Authorizing Provider  doxylamine, Sleep, (UNISOM) 25 MG tablet Take 50 mg by mouth at bedtime as needed for sleep.   Yes [provider]    Physical Exam: Vitals:   02/12/17 2130 02/12/17 2259 02/12/17 2300 02/12/17 2330  BP: (!) 136/92 114/87 129/79 136/85  Pulse: (!) 117 (!) 127 (!) 124 (!) 115  Resp: (!) 23 (!) 25    Temp:      TempSrc:      SpO2: 99% 99% 100% 99%      Constitutional: NAD, calm, comfortable Vitals:   02/12/17 2130 02/12/17 2259 02/12/17 2300 02/12/17 2330  BP: (!) 136/92 114/87 129/79 136/85  Pulse: (!) 117 (!) 127 (!) 124 (!) 115  Resp: (!) 23 (!) 25    Temp:      TempSrc:      SpO2: 99% 99% 100% 99%   Eyes: PERRL, lids and conjunctivae normal ENMT: Mucous membranes are moist. Posterior pharynx clear of any exudate or lesions.Normal dentition.  Neck: normal, supple, no masses, no thyromegaly Respiratory: clear to auscultation bilaterally, no wheezing, no crackles.  Normal respiratory effort. No accessory muscle use.  Cardiovascular: Regular rhythm but tachycardia could No extremity edema. 2+ pedal pulses. No carotid bruits.  Abdomen: no tenderness, no masses palpated. No hepatosplenomegaly. Bowel sounds positive.  Musculoskeletal: no clubbing / cyanosis. No joint deformity upper and lower extremities. Good ROM, no contractures. Normal muscle tone.  Skin: no rashes, lesions, ulcers. No induration Neurologic: CN 2-12 grossly intact. Sensation intact, DTR normal. Strength 5/5 in all 4.  Psychiatric: Normal  judgment and insight. Alert and oriented x 3. Normal mood.    Labs on Admission: I have personally reviewed following labs and imaging studies  CBC: Recent Labs  Lab 02/12/17 2115  WBC 6.3  HGB 14.9  HCT 42.8  MCV 89.2  PLT 193   Basic Metabolic Panel: Recent Labs  Lab 02/12/17 2115  NA 137  K 3.1*  CL 100*  CO2 26  GLUCOSE 106*  BUN 15  CREATININE 1.13  CALCIUM 9.3   GFR: CrCl cannot be calculated (Unknown ideal weight.). Liver Function Tests: Recent Labs  Lab 02/12/17 2115  AST 35  ALT 19  ALKPHOS 37*  BILITOT 0.6  PROT 8.0  ALBUMIN 4.8   No results for input(s): LIPASE, AMYLASE in the last 168 hours. No results for input(s): AMMONIA in the last 168 hours. Coagulation Profile: No results for input(s): INR, PROTIME in the last 168 hours. Cardiac Enzymes: No results for input(s): CKTOTAL, CKMB, CKMBINDEX, TROPONINI in the last 168 hours. BNP (last 3 results) No results for input(s): PROBNP in the last 8760 hours. HbA1C: No results for input(s): HGBA1C in the last 72 hours. CBG: Recent Labs  Lab 02/12/17 2127  GLUCAP 94   Lipid Profile: No results for input(s): CHOL, HDL, LDLCALC, TRIG, CHOLHDL, LDLDIRECT in the last 72 hours. Thyroid Function Tests: No results for input(s): TSH, T4TOTAL, FREET4, T3FREE, THYROIDAB in the last 72 hours. Anemia Panel: No results for input(s): VITAMINB12, FOLATE, FERRITIN, TIBC, IRON, RETICCTPCT in the last 72 hours. Urine analysis: No results found for: COLORURINE, APPEARANCEUR, LABSPEC, PHURINE, GLUCOSEU, HGBUR, BILIRUBINUR, KETONESUR, PROTEINUR, UROBILINOGEN, NITRITE, LEUKOCYTESUR Sepsis Labs: @LABRCNTIP (procalcitonin:4,lacticidven:4) )No results found for this or any previous visit (from the past 240 hour(s)).   Radiological Exams on Admission: Ct Head Wo Contrast  Result Date: 02/12/2017 CLINICAL DATA:  27 year old male with altered mental status. EXAM: CT HEAD WITHOUT CONTRAST TECHNIQUE: Contiguous axial  images were obtained from the base of the skull through the vertex without intravenous contrast. COMPARISON:  Head CT dated 12/16/2014 FINDINGS: Brain: No evidence of acute infarction, hemorrhage, hydrocephalus, extra-axial collection or mass lesion/mass effect. Vascular: No hyperdense vessel or unexpected calcification. Skull: Normal. Negative for fracture or focal lesion. Sinuses/Orbits: No acute finding. Other: None IMPRESSION: Normal unenhanced CT of the brain. Electronically Signed   By: Elgie Collard M.D.   On: 02/12/2017 22:50    EKG: Independently reviewed.  Assessment/Plan Active Problems:   Overdose     #1 drug overdose: Mainly Benadryl. Patient took 4 tablets of 25 mg Benadryl. So far he is awake and alert now. Patient will be monitored overnight with telemetry. Sedation most important thing to look out for. Monitored any change in his mental status. Once cleared medically consults psychiatry. Continue with his IVC for now.  #2 hypokalemia: Continue to replete potassium.  #3 prolonged QTC: Most likely related to the medications. So far improving after hydration and potassium supplementation. Magnesium level also low will replete. Repeat EKG in the morning  #4 sinus tachycardia: Most likely related to  his overdose. Continue monitoring on telemetry     DVT prophylaxis: SCD   Code Status: Full   Family Communication: Mother,Rhonda Rosser   Disposition Plan: To be determined.  Consults called: None  Admission status: Observation   Severity of Illness: The appropriate patient status for this patient is OBSERVATION. Observation status is judged to be reasonable and necessary in order to provide the required intensity of service to ensure the patient's safety. The patient's presenting symptoms, physical exam findings, and initial radiographic and laboratory data in the context of their medical condition is felt to place them at decreased risk for further clinical  deterioration. Furthermore, it is anticipated that the patient will be medically stable for discharge from the hospital within 2 midnights of admission. The following factors support the patient status of observation.   " The patient's presenting symptoms include overdose. " The physical exam findings include altered mental status. " The initial radiographic and laboratory data are QTC of more than 500.     Lonia Blood MD [facility] Pager [phone]  If 7PM-7AM, please contact night-coverage [email or web address] Password [facility]  02/13/2017, 12:36 AM

## 2017-02-13 NOTE — Progress Notes (Signed)
Patient ID: Bobby Sullivan, male   DOB: 06-11-1990, 27 y.o.   MRN: [id number] Patient was admitted early this morning for Benadryl overdose.  Seen and examined the patient at bedside.  He is more awake and answering questions.  Prior medical records and this morning's H&P was reviewed by myself.  Continue Software engineerbedside sitter.  Psychiatry consulted.  Repeat a.m. labs

## 2017-02-13 NOTE — Progress Notes (Signed)
Pt has arrived to room 1510 from ED with sitter.

## 2017-02-14 ENCOUNTER — Inpatient Hospital Stay (HOSPITAL_COMMUNITY)
Admission: AD | Admit: 2017-02-14 | Discharge: 2017-02-18 | DRG: 885 | Disposition: A | Discharge status: Home / Self Care | Payer: Federal, State, Local not specified - Other | Source: Intra-hospital | Attending: Psychiatry | Admitting: Psychiatry

## 2017-02-14 ENCOUNTER — Other Ambulatory Visit: Payer: Self-pay

## 2017-02-14 ENCOUNTER — Encounter (HOSPITAL_COMMUNITY): Payer: Self-pay | Admitting: *Deleted

## 2017-02-14 DIAGNOSIS — F329 Major depressive disorder, single episode, unspecified: Secondary | ICD-10-CM | POA: Diagnosis not present

## 2017-02-14 DIAGNOSIS — Z818 Family history of other mental and behavioral disorders: Secondary | ICD-10-CM

## 2017-02-14 DIAGNOSIS — F845 Asperger's syndrome: Secondary | ICD-10-CM | POA: Diagnosis present

## 2017-02-14 DIAGNOSIS — T50904A Poisoning by unspecified drugs, medicaments and biological substances, undetermined, initial encounter: Secondary | ICD-10-CM | POA: Diagnosis not present

## 2017-02-14 DIAGNOSIS — T450X1A Poisoning by antiallergic and antiemetic drugs, accidental (unintentional), initial encounter: Secondary | ICD-10-CM | POA: Diagnosis not present

## 2017-02-14 DIAGNOSIS — Z8669 Personal history of other diseases of the nervous system and sense organs: Secondary | ICD-10-CM

## 2017-02-14 DIAGNOSIS — F17210 Nicotine dependence, cigarettes, uncomplicated: Secondary | ICD-10-CM | POA: Diagnosis present

## 2017-02-14 DIAGNOSIS — G4700 Insomnia, unspecified: Secondary | ICD-10-CM | POA: Diagnosis present

## 2017-02-14 DIAGNOSIS — F322 Major depressive disorder, single episode, severe without psychotic features: Principal | ICD-10-CM | POA: Diagnosis present

## 2017-02-14 DIAGNOSIS — Z8659 Personal history of other mental and behavioral disorders: Secondary | ICD-10-CM | POA: Diagnosis not present

## 2017-02-14 DIAGNOSIS — Z915 Personal history of self-harm: Secondary | ICD-10-CM

## 2017-02-14 DIAGNOSIS — R000 Tachycardia, unspecified: Secondary | ICD-10-CM | POA: Diagnosis present

## 2017-02-14 DIAGNOSIS — I4581 Long QT syndrome: Secondary | ICD-10-CM | POA: Diagnosis present

## 2017-02-14 DIAGNOSIS — F411 Generalized anxiety disorder: Secondary | ICD-10-CM | POA: Diagnosis present

## 2017-02-14 DIAGNOSIS — Z62810 Personal history of physical and sexual abuse in childhood: Secondary | ICD-10-CM | POA: Diagnosis present

## 2017-02-14 DIAGNOSIS — T450X4A Poisoning by antiallergic and antiemetic drugs, undetermined, initial encounter: Secondary | ICD-10-CM | POA: Diagnosis present

## 2017-02-14 DIAGNOSIS — T50902A Poisoning by unspecified drugs, medicaments and biological substances, intentional self-harm, initial encounter: Secondary | ICD-10-CM

## 2017-02-14 DIAGNOSIS — R450 Nervousness: Secondary | ICD-10-CM | POA: Diagnosis not present

## 2017-02-14 LAB — COMPREHENSIVE METABOLIC PANEL
ALBUMIN: 4.1 g/dL (ref 3.5–5.0)
ALT: 18 U/L (ref 17–63)
AST: 25 U/L (ref 15–41)
Alkaline Phosphatase: 28 U/L — ABNORMAL LOW (ref 38–126)
Anion gap: 4 — ABNORMAL LOW (ref 5–15)
BUN: 13 mg/dL (ref 6–20)
CALCIUM: 8.8 mg/dL — AB (ref 8.9–10.3)
CHLORIDE: 107 mmol/L (ref 101–111)
CO2: 28 mmol/L (ref 22–32)
CREATININE: 1.09 mg/dL (ref 0.61–1.24)
GFR calc non Af Amer: >60 mL/min (ref >60)
GLUCOSE: 88 mg/dL (ref 65–99)
Potassium: 4.1 mmol/L (ref 3.5–5.1)
SODIUM: 139 mmol/L (ref 135–145)
Total Bilirubin: 0.6 mg/dL (ref 0.3–1.2)
Total Protein: 6.8 g/dL (ref 6.5–8.1)

## 2017-02-14 LAB — CBC WITH DIFFERENTIAL/PLATELET
BASOS ABS: 0.1 10*3/uL (ref 0.0–0.1)
BASOS PCT: 1 %
EOS ABS: 0.4 10*3/uL (ref 0.0–0.7)
EOS PCT: 6 %
HCT: 41.2 % (ref 39.0–52.0)
HEMOGLOBIN: 13.9 g/dL (ref 13.0–17.0)
Lymphocytes Relative: 32 %
Lymphs Abs: 2.2 10*3/uL (ref 0.7–4.0)
MCH: 31 pg (ref 26.0–34.0)
MCHC: 33.7 g/dL (ref 30.0–36.0)
MCV: 91.8 fL (ref 78.0–100.0)
Monocytes Absolute: 1 10*3/uL (ref 0.1–1.0)
Monocytes Relative: 15 %
NEUTROS PCT: 46 %
Neutro Abs: 3 10*3/uL (ref 1.7–7.7)
PLATELETS: 188 10*3/uL (ref 150–400)
RBC: 4.49 MIL/uL (ref 4.22–5.81)
RDW: 12.9 % (ref 11.5–15.5)
WBC: 6.7 10*3/uL (ref 4.0–10.5)

## 2017-02-14 LAB — MAGNESIUM: Magnesium: 1.9 mg/dL (ref 1.7–2.4)

## 2017-02-14 MED ORDER — MAGNESIUM HYDROXIDE 400 MG/5ML PO SUSP: 30.0000 mL | Freq: Every day | ORAL | Status: DC | PRN

## 2017-02-14 MED ORDER — HYDROCERIN EX CREA
TOPICAL_CREAM | Freq: Two times a day (BID) | CUTANEOUS | Status: DC
  Filled 2017-02-14: qty 113

## 2017-02-14 MED ORDER — ACETAMINOPHEN 325 MG PO TABS: 650.0000 mg | ORAL_TABLET | Freq: Four times a day (QID) | ORAL | Status: DC | PRN

## 2017-02-14 MED ORDER — ALUM & MAG HYDROXIDE-SIMETH 200-200-20 MG/5ML PO SUSP: 30.0000 mL | ORAL | Status: DC | PRN

## 2017-02-14 MED ORDER — TRAZODONE HCL 50 MG PO TABS: 50.0000 mg | ORAL_TABLET | Freq: Every evening | ORAL | Status: DC | PRN

## 2017-02-14 MED ORDER — HYDROXYZINE HCL 25 MG PO TABS
25.0000 mg | ORAL_TABLET | Freq: Three times a day (TID) | ORAL | Status: DC | PRN
  Administered 2017-02-14 – 2017-02-15 (×2): 25 mg via ORAL
  Filled 2017-02-14 (×2): qty 1

## 2017-02-14 NOTE — Progress Notes (Addendum)
  DATA ACTION RESPONSE  Objective- Pt. is visible in the dayroom, seen watching TV and interacting with peers.Presents with an anxious affect and mood. Pt states he would like to work on his anxiety and insomnia while here.   Subjective- Denies having any SI/HI/AVH/Pain at this time. Is cooperative and remains safe on the unit.  1:1 interaction in private to establish rapport. Encouragement, education, & support given from staff.  PRN vistaril requested and will re-eval accordingly.   Safety maintained with Q 15 checks. Continue with POC.

## 2017-02-14 NOTE — Progress Notes (Signed)
Urine sample given. Pending results.

## 2017-02-14 NOTE — Tx Team (Signed)
Initial Treatment Plan 02/14/2017 3:22 PM Bobby Sullivan [id number]    PATIENT STRESSORS: Other: insomnia   PATIENT STRENGTHS: Ability for insight Average or above average intelligence Capable of independent living General fund of knowledge Motivation for treatment/growth   PATIENT IDENTIFIED PROBLEMS: Depression Anxiety Insomnia Suicidal thoughts "Ways to get to sleep and anxiety"                     DISCHARGE CRITERIA:  Ability to meet basic life and health needs Improved stabilization in mood, thinking, and/or behavior Verbal commitment to aftercare and medication compliance  PRELIMINARY DISCHARGE PLAN: Attend aftercare/continuing care group Return to previous living arrangement  PATIENT/FAMILY INVOLVEMENT: This treatment plan has been presented to and reviewed with the patient, Bobby Sullivan, and/or family member, .  The patient and family have been given the opportunity to ask questions and make suggestions.  Bobby Sullivan, Bobby Sullivan, [place]RN 02/14/2017, 3:22 PM

## 2017-02-14 NOTE — Progress Notes (Addendum)
[facility] Room 407-2 Attending MD: Jannifer FranklinAkintayo Accepting MD: [place]  RN Report #: [phone] Please call report before patient transports.   Patient will transport by [employer]. Transport arranged for 2pm.

## 2017-02-14 NOTE — Progress Notes (Signed)
LCSW following for inpatient psych.   Patient will go to inpatient psych placement when medically cleared.   LCSW will continue to follow.   Bobby GandyBernette Kumari Sculley, LSCW [facility] [phone]

## 2017-02-14 NOTE — Progress Notes (Signed)
Called report to [place], RN [facility].  Patient transported via [facility] with one bag of belongings and IVC paperwork.

## 2017-02-14 NOTE — Progress Notes (Signed)
Adult Psychoeducational Group Note  Date:  02/14/2017 Time: 1600 Group Topic/Focus:  Goals Group:   The focus of this group is to help patients establish daily goals to achieve during treatment and discuss how the patient can incorporate goal setting into their daily lives to aide in recovery.  Participation Level:  Active  Participation Quality:  Appropriate  Affect:  Appropriate  Cognitive:  Appropriate  Insight: Appropriate  Engagement in Group:  Engaged  Modes of Intervention:  Education  Additional Comments:     

## 2017-02-14 NOTE — Progress Notes (Signed)
The patient verbalized that he was pleased to be here and that he is working on having a positive attitude. He is complaining of having a long history of insomnia and that he will be meditating tonight.

## 2017-02-14 NOTE — Discharge Summary (Signed)
Physician Discharge Summary  Bobby Sullivan QIO:[id number] DOB: Jul 22, 1990 DOA: 02/12/2017  PCP: Patient, No Pcp Per  Admit date: 02/12/2017 Discharge date: 02/14/2017  Admitted From: Home Disposition: Inpatient psychiatric facility   Recommendations for Outpatient Follow-up:  1. Follow up with provider at inpatient psychiatric facility upon transfer   Home Health: No Equipment/Devices: None Discharge Condition: Stable CODE STATUS: Full  diet recommendation: Regular  Brief/Interim Summary: 27 year old male with history of recurrent overdose of medications with multiple medications was brought in by his mother for overdose with Benadryl.  Patient was initially obtunded in the ED.  He was admitted for observation poison control was notified.  He was started on intravenous fluids and electrolytes were replaced.  Initially which has improved.  Psychiatry evaluated the patient and recommended inpatient psychiatric hospitalization.  Patient is cleared medically for discharge and will be transferred to inpatient psychiatric facility once bed is available.  Discharge Diagnoses:  Principal Problem:   Major depressive disorder, single episode, severe (HCC) Active Problems:   Overdose    Drug overdose with Benadryl - Patient apparently overdosed on Benadryl.  Initially he was apparently obtunded.  His mental status has much improved.  Poison control was involved.  His electrolytes have been replaced.  His EKG is much better.  He is medically stable for discharge.  Psychiatry recommends inpatient psychiatric hospitalization.  He will be transferred to inpatient psychiatric facility once bed is available.  Hypokalemia -Replaced.  Resolved  Prolonged QTC -Improved.   Sinus tachycardia: Most likely related to his overdose.  -Resolved   Discharge Instructions  Discharge Instructions    Call MD for:  difficulty breathing, headache or visual disturbances   Complete by:  As directed    Call MD for:  extreme fatigue   Complete by:  As directed    Call MD for:  hives   Complete by:  As directed    Call MD for:  persistant dizziness or light-headedness   Complete by:  As directed    Call MD for:  persistant nausea and vomiting   Complete by:  As directed    Call MD for:  severe uncontrolled pain   Complete by:  As directed    Call MD for:  temperature >100.4   Complete by:  As directed    Diet general   Complete by:  As directed    Increase activity slowly   Complete by:  As directed      Allergies as of 02/14/2017   No Known Allergies     Medication List    STOP taking these medications   doxylamine (Sleep) 25 MG tablet Commonly known as:  UNISOM       No Known Allergies  Consultations:  Psychiatry   Procedures/Studies: Ct Head Wo Contrast  Result Date: 02/12/2017 CLINICAL DATA:  27 year old male with altered mental status. EXAM: CT HEAD WITHOUT CONTRAST TECHNIQUE: Contiguous axial images were obtained from the base of the skull through the vertex without intravenous contrast. COMPARISON:  Head CT dated 12/16/2014 FINDINGS: Brain: No evidence of acute infarction, hemorrhage, hydrocephalus, extra-axial collection or mass lesion/mass effect. Vascular: No hyperdense vessel or unexpected calcification. Skull: Normal. Negative for fracture or focal lesion. Sinuses/Orbits: No acute finding. Other: None IMPRESSION: Normal unenhanced CT of the brain. Electronically Signed   By: Elgie Collard M.D.   On: 02/12/2017 22:50     Subjective: Patient seen and examined at bedside.  He is sleeping, he woke up on calling his name and answered questions.  No overnight fever, nausea or vomiting. Discharge Exam: Vitals:   02/13/17 2202 02/14/17 0626  BP: (!) 119/59 (!) 109/56  Pulse: 66 70  Resp: 16 14  Temp: 98.4 F (36.9 C) 98 F (36.7 C)  SpO2: 99% 99%   Vitals:   02/13/17 1230 02/13/17 1405 02/13/17 2202 02/14/17 0626  BP: 117/71 130/67 (!) 119/59 (!)  109/56  Pulse: 86 87 66 70  Resp: 16 20 16 14   Temp:   98.4 F (36.9 C) 98 F (36.7 C)  TempSrc:   Oral Oral  SpO2: 98% 99% 99% 99%  Weight:  77.2 kg (170 lb 3.1 oz)    Height:  6' (1.829 m)      General: Pt is waking up on calling his name.  No acute distress Cardiovascular: Rate controlled, S1/S2 + Respiratory: Decreased breath sounds at bases  abdominal: Soft, NT, ND, bowel sounds + Extremities: no edema, no cyanosis    The results of significant diagnostics from this hospitalization (including imaging, microbiology, ancillary and laboratory) are listed below for reference.     Microbiology: No results found for this or any previous visit (from the past 240 hour(s)).   Labs: BNP (last 3 results) No results for input(s): BNP in the last 8760 hours. Basic Metabolic Panel: Recent Labs  Lab 02/12/17 2115 02/13/17 0031 02/13/17 0525 02/14/17 0617  NA 137  --  139 139  K 3.1*  --  3.6 4.1  CL 100*  --  107 107  CO2 26  --  27 28  GLUCOSE 106*  --  88 88  BUN 15  --  12 13  CREATININE 1.13  --  1.08 1.09  CALCIUM 9.3  --  8.1* 8.8*  MG  --  1.7  --  1.9   Liver Function Tests: Recent Labs  Lab 02/12/17 2115 02/13/17 0525 02/14/17 0617  AST 35 23 25  ALT 19 15* 18  ALKPHOS 37* 27* 28*  BILITOT 0.6 0.4 0.6  PROT 8.0 6.2* 6.8  ALBUMIN 4.8 3.9 4.1   No results for input(s): LIPASE, AMYLASE in the last 168 hours. No results for input(s): AMMONIA in the last 168 hours. CBC: Recent Labs  Lab 02/12/17 2115 02/13/17 0525 02/14/17 0617  WBC 6.3 5.5 6.7  NEUTROABS  --   --  3.0  HGB 14.9 12.3* 13.9  HCT 42.8 36.7* 41.2  MCV 89.2 90.6 91.8  PLT 193 185 188   Cardiac Enzymes: No results for input(s): CKTOTAL, CKMB, CKMBINDEX, TROPONINI in the last 168 hours. BNP: Invalid input(s): POCBNP CBG: Recent Labs  Lab 02/12/17 2127  GLUCAP 94   D-Dimer No results for input(s): DDIMER in the last 72 hours. Hgb A1c No results for input(s): HGBA1C in the last  72 hours. Lipid Profile No results for input(s): CHOL, HDL, LDLCALC, TRIG, CHOLHDL, LDLDIRECT in the last 72 hours. Thyroid function studies No results for input(s): TSH, T4TOTAL, T3FREE, THYROIDAB in the last 72 hours.  Invalid input(s): FREET3 Anemia work up No results for input(s): VITAMINB12, FOLATE, FERRITIN, TIBC, IRON, RETICCTPCT in the last 72 hours. Urinalysis No results found for: COLORURINE, APPEARANCEUR, LABSPEC, PHURINE, GLUCOSEU, HGBUR, BILIRUBINUR, KETONESUR, PROTEINUR, UROBILINOGEN, NITRITE, LEUKOCYTESUR Sepsis Labs Invalid input(s): PROCALCITONIN,  WBC,  LACTICIDVEN Microbiology No results found for this or any previous visit (from the past 240 hour(s)).   Time coordinating discharge: 35 minutes  SIGNED:   Glade LloydKshitiz Lyanne Kates, MD  [facility] 02/14/2017, 8:57 AM Pager: [phone]  If 7PM-7AM, please contact  night-coverage [email or web address] Password [email or web address]

## 2017-02-14 NOTE — Progress Notes (Signed)
Bobby Sullivan is a 27 year old male pt admitted on involuntary basis after overdose of bedadryl. On admission, he denies any current SI and reports that he has done this in the past but does not report any specific stress or trigger. He reports that this is his first in-patient hospitalization. He reports that he has problems with anxiety and sleep and reports his biggest issue is his inability to sleep and [facility] that particular problem. He denies any substance abuse issues. He reports that he lives with his mother and reports that he will go back to the same living situation upon discharge. He was oriented to the unit and safety maintained.

## 2017-02-14 NOTE — Progress Notes (Signed)
Date:  February 14, 2017 Chart reviewed for concurrent status and case management needs.  Will continue to follow patient progress.  Discharge Planning: following for needs.   Present plan is for inpat. Psych. admission Expected discharge date: February 17, 2017 Marcelle SmilingRhonda Davis, BSN, [place]RN3, [place]CCM   [phone]

## 2017-02-14 NOTE — Clinical Social Work Note (Signed)
Clinical Social Work Assessment  Patient Details  Name: Bobby Sullivan MRN: [id number] Date of Birth: Nov 02, 1990  Date of referral:  02/14/17               Reason for consult:  Facility Placement(Inpatient psych)                Permission sought to share information with:  Facility Sport and exercise psychologist, Family Supports Permission granted to share information::  Yes, Verbal Permission Granted  Name::     Engineer, drilling::  Inpatient psych  Relationship::  Mother  Contact Information:     Housing/Transportation Living arrangements for the past 2 months:  Single Family Home Source of Information:  Patient Patient Interpreter Needed:  None Criminal Activity/Legal Involvement Pertinent to Current Situation/Hospitalization:  No - Comment as needed Significant Relationships:  Siblings, Parents, Friend Lives with:  Parents Do you feel safe going back to the place where you live?  Yes Need for family participation in patient care:  Yes (Comment)  Care giving concerns:  Patient has a history of mental health issues. Patient is not currently on meds and patient reports that he does not have an outside provide.    Social Worker assessment / plan:  LCSW following for inpatient psych placement.  LCSW met with patient at bedside no family present. Patient is IVC, has Actuary.   Patient admitted for intentional OD by [facility],   Patient reports that he was trying to get some sleep. Patient reports he suffers from insomnia and has been on multiple sleep meds in the past.    Patient reports that he lives at home with his mom and that he has support of friends and family.   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   Patient reports that he has not had any attempts in the past and has not been inpatient. Patients H&P states that patient has a history of recurrent suicide attempts and admissions.   Patient reports that he does not have an outside provider.  Patients mother reported on H&P that patient had been seen at [facility] on the past but he did not think it was a good fit. H&P also stated patient has a history positive for Asperger's.    PLAN: Patient will go inpatient psych at dc.   Employment status:  [employer] information:  Self Pay (Medicaid Pending) PT Recommendations:  Not assessed at this time Information / Referral to community resources:  Inpatient Psychiatric Care (Comment Required)(Impatient psych is recommended for patient )  Patient/Family's Response to care:  Patient is apprehensive to inpatient psych, but willing to go. Patient is under IVC.    Patient/Family's Understanding of and Emotional Response to Diagnosis, Current Treatment, and Prognosis:  Patient is understanding of current diagnosis and agreeable to treatment plan.   Emotional Assessment Appearance:  Appears stated age Attitude/Demeanor/Rapport:    Affect (typically observed):  Calm Orientation:  Oriented to Self, Oriented to Place, Oriented to  Time, Oriented to Situation Alcohol / Substance use:    Psych involvement (Current and /or in the community):  No (Comment)  Discharge Needs  Concerns to be addressed:  Mental Health Concerns, Financial / Insurance Concerns Readmission within the last 30 days:  No Current discharge risk:  None Barriers to Discharge:  No Barriers Identified   Servando Snare, LCSW 02/14/2017, 10:45 AM

## 2017-02-14 NOTE — Progress Notes (Signed)
Pt belongings locked up on admission are as follows: boots,belt,no cell, and pt took 6.00 on unit

## 2017-02-15 DIAGNOSIS — Z818 Family history of other mental and behavioral disorders: Secondary | ICD-10-CM

## 2017-02-15 DIAGNOSIS — R450 Nervousness: Secondary | ICD-10-CM

## 2017-02-15 DIAGNOSIS — F419 Anxiety disorder, unspecified: Secondary | ICD-10-CM

## 2017-02-15 DIAGNOSIS — G4700 Insomnia, unspecified: Secondary | ICD-10-CM

## 2017-02-15 DIAGNOSIS — T450X1A Poisoning by antiallergic and antiemetic drugs, accidental (unintentional), initial encounter: Secondary | ICD-10-CM

## 2017-02-15 LAB — CBC
HCT: 44.9 % (ref 39.0–52.0)
Hemoglobin: 15.3 g/dL (ref 13.0–17.0)
MCH: 30.7 pg (ref 26.0–34.0)
MCHC: 34.1 g/dL (ref 30.0–36.0)
MCV: 90 fL (ref 78.0–100.0)
PLATELETS: 245 10*3/uL (ref 150–400)
RBC: 4.99 MIL/uL (ref 4.22–5.81)
RDW: 12.4 % (ref 11.5–15.5)
WBC: 6.4 10*3/uL (ref 4.0–10.5)

## 2017-02-15 LAB — COMPREHENSIVE METABOLIC PANEL
ALT: 18 U/L (ref 17–63)
ANION GAP: 7 (ref 5–15)
AST: 26 U/L (ref 15–41)
Albumin: 4.6 g/dL (ref 3.5–5.0)
Alkaline Phosphatase: 40 U/L (ref 38–126)
BUN: 16 mg/dL (ref 6–20)
CHLORIDE: 102 mmol/L (ref 101–111)
CO2: 29 mmol/L (ref 22–32)
CREATININE: 1.03 mg/dL (ref 0.61–1.24)
Calcium: 9.5 mg/dL (ref 8.9–10.3)
GFR calc Af Amer: >60 mL/min (ref >60)
GFR calc non Af Amer: >60 mL/min (ref >60)
Glucose, Bld: 98 mg/dL (ref 65–99)
POTASSIUM: 3.8 mmol/L (ref 3.5–5.1)
SODIUM: 138 mmol/L (ref 135–145)
Total Bilirubin: 0.6 mg/dL (ref 0.3–1.2)
Total Protein: 7.9 g/dL (ref 6.5–8.1)

## 2017-02-15 LAB — TSH: TSH: 3.181 u[IU]/mL (ref 0.350–4.500)

## 2017-02-15 LAB — RAPID URINE DRUG SCREEN, HOSP PERFORMED
AMPHETAMINES: NOT DETECTED
BENZODIAZEPINES: NOT DETECTED
Barbiturates: NOT DETECTED
Cocaine: NOT DETECTED
OPIATES: NOT DETECTED
TETRAHYDROCANNABINOL: NOT DETECTED

## 2017-02-15 NOTE — [facility] Group Notes (Signed)
LCSW Group Therapy Note 02/15/2017 4:33 PM  Type of Therapy/Topic: Group Therapy: Feelings about Diagnosis  Participation Level: Active   Description of Group:  This group will allow patients to explore their thoughts and feelings about diagnoses they have received. Patients will be guided to explore their level of understanding and acceptance of these diagnoses. Facilitator will encourage patients to process their thoughts and feelings about the reactions of others to their diagnosis and will guide patients in identifying ways to discuss their diagnosis with significant others in their lives. This group will be process-oriented, with patients participating in exploration of their own experiences, giving and receiving support, and processing challenge from other group members.  Therapeutic Goals: 1. Patient will demonstrate understanding of diagnosis as evidenced by identifying two or more symptoms of the disorder 2. Patient will be able to express two feelings regarding the diagnosis 3. Patient will demonstrate their ability to communicate their needs through discussion and/or role play  Summary of Patient Progress:   Bobby LongsJoseph was engaged during the entire group session. He participated and contributed to the group's discussion.    Therapeutic Modalities:  Cognitive Behavioral Therapy Brief Therapy Feelings Identification    Chiann Goffredo [name] LCSWA Clinical Social Worker

## 2017-02-15 NOTE — [facility] Suicide Risk Assessment (Signed)
[facility]   Nursing information obtained from:   patient and chart Demographic factors:   27 year old single male, lives with mother, employed  Current Mental Status:   see below Loss Factors:   accidental overdose  Historical Factors:   history of prior overdoses , anxiety, history of Autism Spectrum Disorder ( Asperger's)  Risk Reduction Factors:   resilience, physical health  Total Time spent with patient: 45 minutes Principal Problem:  S/P Overdose, undetermined intent  Diagnosis:   Patient Active Problem List   Diagnosis Date Noted  . MDD (major depressive disorder) [F32.9] 02/14/2017  . Overdose [T50.901A] 02/13/2017  . Major depressive disorder, single episode, severe (HCC) [F32.2] 02/13/2017  . Generalized anxiety disorder [F41.1] 08/03/2015  . Seizure disorder (HCC) [G40.909]   . Leukocytosis [D72.829] 12/16/2014  . Diphenhydramine overdose [T45.0X1A] 12/16/2014  . Sinus tachycardia [R00.0] 12/16/2014  . Prolonged Q-T interval on ECG [R94.31] 12/16/2014  . Insomnia, uncontrolled [G47.00] 08/07/2014  . Diphenhydramine overdose of undetermined intent [T45.0X4A] 08/06/2014  . Asperger's syndrome [F84.5] 08/06/2014  . 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  World Science writerHealth Organization [facility](WHO). Score between 0-7:  no or low risk or alcohol related problems. Score between 8-15:  moderate risk of alcohol related problems. Score between 16-19:  high risk of alcohol related problems. Score 20 or above:  warrants further diagnostic evaluation for alcohol dependence and treatment.   CLINICAL FACTORS:  27 year old single male , lives with mother, status post overdose on antihistamine which resulted in EKG changes that required initial  medical admission. Reports overdose was accidental, not suicidal , and an attempt to sleep. Endorses long history of insomnia and of anxiety. Mother reports history of multiple prior episodes of antihistamine overdose/abuse, and describes a history of /pattern of abusing these medications .       Psychiatric Specialty Exam: Physical Exam  ROS  Blood pressure 102/71, pulse 94, temperature 98.4 F (36.9 C), temperature source Oral, resp. rate 16, height 5\' 10"  (1.778 m), weight 76.2 kg (168 lb).Body mass index is 24.11 kg/m.  See admission note MSE    COGNITIVE FEATURES THAT CONTRIBUTE TO RISK:  Closed-mindedness and Loss of executive function    SUICIDE RISK:   Moderate:  Frequent suicidal ideation with limited intensity, and duration, some specificity in terms of plans, no associated intent, good self-control, limited dysphoria/symptomatology, some risk factors present, and identifiable protective factors, including available and accessible social support.  PLAN OF CARE: Patient will be admitted to inpatient psychiatric unit for stabilization and safety. Will provide and encourage milieu participation. Provide medication management and maked adjustments as needed.  Will follow daily.    I certify that inpatient services furnished can reasonably be expected to improve the patient's condition.   Craige CottaFernando A Shawndra Clute, MD 02/15/2017, 3:30 PM

## 2017-02-15 NOTE — Progress Notes (Signed)
Patient has been anxious endorses positive auditory hallucinations.  Patient has requested prn medications which proved effective.   Assess for safety, offer medications as prescribed, engage patient in 1:1 staff talks.   Continue to monitor as planned. Patient able to contract for safety.

## 2017-02-15 NOTE — [facility] Group Notes (Signed)
Pt attended group activity on mindfulness. Pt was alert and engaged in group exercise. 

## 2017-02-15 NOTE — Progress Notes (Signed)
Recreation Therapy Notes  Animal-Assisted Activity (AAA) Program Checklist/Progress Notes Patient Eligibility Criteria Checklist & Daily Group note for Rec TxIntervention  Date: 01.22.2019 Time: 2:45pm [address]   AAA/T Program Assumption of Risk Form signed by Patient/ or Parent Legal Guardian Yes  Patient is free of allergies or sever asthma Yes  Patient reports no fear of animals Yes  Patient reports no history of cruelty to animals Yes  Patient understands his/her participation is voluntary Yes  Behavioral Response: Did not attend.   Marykay Lexenise L Rejoice Heatwole, LRT/CTRS        Jearl KlinefelterBlanchfield, Marieann Zipp L 02/15/2017 4:07 PM
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
## 2017-02-15 NOTE — Progress Notes (Signed)
  DATA ACTION RESPONSE  Objective- Pt. is visible in the dayroom, seen watching TV and interacting with peers.Presents with an anxious affect and mood. Pt states his anxiety is "more manageable" this evening.  Subjective- Denies having any SI/HI/AVH/Pain at this time. Is cooperative and remains safe on the unit.  1:1 interaction in private to establish rapport. Encouragement, education, & support given from staff. PRN vistaril requested and will re-eval accordingly.   Safety maintained with Q 15 checks. Continue with POC.

## 2017-02-16 DIAGNOSIS — F329 Major depressive disorder, single episode, unspecified: Secondary | ICD-10-CM

## 2017-02-16 DIAGNOSIS — F17210 Nicotine dependence, cigarettes, uncomplicated: Secondary | ICD-10-CM

## 2017-02-16 MED ORDER — HYDROXYZINE HCL 25 MG PO TABS
25.0000 mg | ORAL_TABLET | Freq: Every evening | ORAL | Status: DC | PRN
  Administered 2017-02-16: 25 mg via ORAL

## 2017-02-16 NOTE — Progress Notes (Signed)
[facility] MD Progress Note  02/16/2017 3:06 PM Bobby Sullivan  MRN:  [id number]   Subjective:  Patient reports that he feels fine. He denies any depression and reports only anxiety. He denies any SI/HI/AVH and contracts for safety. He continues to refuse any medication for anxiety or depression, but continue the Vistaril.   Objective: Patient's chart and findings reviewed and discussed with treatment team. Patient presents in the day room interacting the day room appropriately. Patient has been attending groups. He minimizes any symptoms and denies the need of any medications.Will continue current treatment and encourage medications.    Principal Problem: Major depressive disorder, single episode, severe ([place]) Diagnosis:   Patient Active Problem List   Diagnosis Date Noted  . MDD (major depressive disorder) [F32.9] 02/14/2017  . Overdose [T50.901A] 02/13/2017  . Major depressive disorder, single episode, severe ([place]) [F32.2] 02/13/2017  . Generalized anxiety disorder [F41.1] 08/03/2015  . Seizure disorder ([place]) [B28.413]   . Leukocytosis [D72.829] 12/16/2014  . Diphenhydramine overdose [T45.0X1A] 12/16/2014  . Sinus tachycardia [R00.0] 12/16/2014  . Prolonged Q-T interval on ECG [R94.31] 12/16/2014  . Insomnia, uncontrolled [G47.00] 08/07/2014  . Diphenhydramine overdose of undetermined intent [T45.0X4A] 08/06/2014  . Asperger's syndrome [F84.5] 08/06/2014  . Adjustment disorder with emotional disturbance [F43.29] 06/15/2013   Total Time spent with patient: 15 minutes  Past Psychiatric History: See H&P  Past Medical History:  Past Medical History:  Diagnosis Date  . Anxiety   . Aspergers' syndrome   . Insomnia    History reviewed. No pertinent surgical history. Family History:  Family History  Problem Relation Age of Onset  . Bipolar disorder Mother    Family Psychiatric  History: See H&P Social History:  Social History   Substance and Sexual Activity  Alcohol Use Yes    Comment: occasional     Social History   Substance and Sexual Activity  Drug Use Yes   Comment: Per triple C, benadryl, duster     Social History   Socioeconomic History  . Marital status: Married    Spouse name: None  . Number of children: None  . Years of education: None  . Highest education level: None  Social Needs  . Financial resource strain: None  . Food insecurity - worry: None  . Food insecurity - inability: None  . Transportation needs - medical: None  . Transportation needs - non-medical: None  Occupational History  . None  Tobacco Use  . Smoking status: Current Every Day Smoker    Packs/day: 0.10    Types: Cigarettes  . Smokeless tobacco: Never Used  Substance and Sexual Activity  . Alcohol use: Yes    Comment: occasional  . Drug use: Yes    Comment: Per triple C, benadryl, duster   . Sexual activity: None  Other Topics Concern  . None  Social History Narrative  . None   Additional Social History:                         Sleep: Good  Appetite:  Good  Current Medications: Current Facility-Administered Medications  Medication Dose Route Frequency Provider Last Rate Last Dose  . acetaminophen (TYLENOL) tablet 650 mg  650 mg Oral Q6H PRN Derrill Center, NP      . alum & mag hydroxide-simeth (MAALOX/MYLANTA) 200-200-20 MG/5ML suspension 30 mL  30 mL Oral Q4H PRN Derrill Center, NP      . hydrOXYzine (ATARAX/VISTARIL) tablet 25 mg  25 mg  Oral QHS PRN Money, Lowry Ram, FNP      . magnesium hydroxide (MILK OF MAGNESIA) suspension 30 mL  30 mL Oral Daily PRN Derrill Center, NP      . traZODone (DESYREL) tablet 50 mg  50 mg Oral QHS PRN Derrill Center, NP        Lab Results:  Results for orders placed or performed during the hospital encounter of 02/14/17 (from the past 48 hour(s))  Urine rapid drug screen (hosp performed)not at [facility]     Status: None   Collection Time: 02/14/17  9:33 PM  Result Value Ref Range   Opiates NONE DETECTED NONE  DETECTED   Cocaine NONE DETECTED NONE DETECTED   Benzodiazepines NONE DETECTED NONE DETECTED   Amphetamines NONE DETECTED NONE DETECTED   Tetrahydrocannabinol NONE DETECTED NONE DETECTED   Barbiturates NONE DETECTED NONE DETECTED    Comment: (NOTE) DRUG SCREEN FOR MEDICAL PURPOSES ONLY.  IF CONFIRMATION IS NEEDED FOR ANY PURPOSE, NOTIFY LAB WITHIN 5 DAYS. LOWEST DETECTABLE LIMITS FOR URINE DRUG SCREEN Drug Class                     Cutoff (ng/mL) Amphetamine and metabolites    1000 Barbiturate and metabolites    200 Benzodiazepine                 619 Tricyclics and metabolites     300 Opiates and metabolites        300 Cocaine and metabolites        300 THC                            50 Performed at [facility], [place] [address], [place], [place] [address]   CBC     Status: None   Collection Time: 02/15/17  7:36 AM  Result Value Ref Range   WBC 6.4 4.0 - 10.5 K/uL   RBC 4.99 4.22 - 5.81 MIL/uL   Hemoglobin 15.3 13.0 - 17.0 g/dL   HCT 44.9 39.0 - 52.0 %   MCV 90.0 78.0 - 100.0 fL   MCH 30.7 26.0 - 34.0 pg   MCHC 34.1 30.0 - 36.0 g/dL   RDW 12.4 11.5 - 15.5 %   Platelets 245 150 - 400 K/uL    Comment: Performed at [facility], [place] [address], [place], [place] [address]  Comprehensive metabolic panel     Status: None   Collection Time: 02/15/17  7:36 AM  Result Value Ref Range   Sodium 138 135 - 145 mmol/L   Potassium 3.8 3.5 - 5.1 mmol/L   Chloride 102 101 - 111 mmol/L   CO2 29 22 - 32 mmol/L   Glucose, Bld 98 65 - 99 mg/dL   BUN 16 6 - 20 mg/dL   Creatinine, Ser 1.03 0.61 - 1.24 mg/dL   Calcium 9.5 8.9 - 10.3 mg/dL   Total Protein 7.9 6.5 - 8.1 g/dL   Albumin 4.6 3.5 - 5.0 g/dL   AST 26 15 - 41 U/L   ALT 18 17 - 63 U/L   Alkaline Phosphatase 40 38 - 126 U/L   Total Bilirubin 0.6 0.3 - 1.2 mg/dL   GFR calc non Af Amer >60 >60 mL/min   GFR calc Af Amer >60 >60 mL/min    Comment: (NOTE) The eGFR has been calculated using the  CKD EPI equation. This calculation has not been  validated in all clinical situations. eGFR's persistently <60 mL/min signify possible Chronic Kidney Disease.    Anion gap 7 5 - 15    Comment: Performed at [facility], [place] [address], [place], [place] [address]  TSH     Status: None   Collection Time: 02/15/17  7:36 AM  Result Value Ref Range   TSH 3.181 0.350 - 4.500 uIU/mL    Comment: Performed by a 3rd Generation assay with a functional sensitivity of <=0.01 uIU/mL. Performed at [facility], [place] [address], [place], [place] [address]     Blood Alcohol level:  Lab Results  Component Value Date   ETH <10 02/12/2017   ETH <10 26/37/8588    Metabolic Disorder Labs: No results found for: HGBA1C, MPG No results found for: PROLACTIN No results found for: CHOL, TRIG, HDL, CHOLHDL, VLDL, LDLCALC  Physical Findings: AIMS: Facial and Oral Movements Muscles of Facial Expression: None, normal Lips and Perioral Area: None, normal Jaw: None, normal Tongue: None, normal,Extremity Movements Upper (arms, wrists, hands, fingers): None, normal Lower (legs, knees, ankles, toes): None, normal, Trunk Movements Neck, shoulders, hips: None, normal, Overall Severity Severity of abnormal movements (highest score from questions above): None, normal Incapacitation due to abnormal movements: None, normal Patient's awareness of abnormal movements (rate only patient's report): No Awareness, Dental Status Current problems with teeth and/or dentures?: No Does patient usually wear dentures?: No  CIWA:    COWS:     Musculoskeletal: Strength & Muscle Tone: within normal limits Gait & Station: normal Patient leans: N/A  Psychiatric Specialty Exam: Physical Exam  Nursing note and vitals reviewed. Constitutional: He is oriented to person, place, and time. He appears well-developed and well-nourished.  Cardiovascular: Normal rate.  Musculoskeletal: Normal range  of motion.  Neurological: He is alert and oriented to person, place, and time.  Skin: Skin is warm.    Review of Systems  Constitutional: Negative.   HENT: Negative.   Eyes: Negative.   Respiratory: Negative.   Cardiovascular: Negative.   Gastrointestinal: Negative.   Genitourinary: Negative.   Musculoskeletal: Negative.   Skin: Negative.   Neurological: Negative.   Endo/Heme/Allergies: Negative.   Psychiatric/Behavioral: Negative for hallucinations and suicidal ideas. The patient is nervous/anxious.     Blood pressure 106/76, pulse 93, temperature 97.6 F (36.4 C), temperature source Oral, resp. rate 12, height _0  (1.778 m), weight 76.2 kg (168 lb).Body mass index is 24.11 kg/m.  General Appearance: Casual  Eye Contact:  Good  Speech:  Clear and Coherent and Normal Rate  Volume:  Normal  Mood:  Anxious  Affect:  Congruent  Thought Process:  Goal Directed and Descriptions of Associations: Intact  Orientation:  Full (Time, Place, and Person)  Thought Content:  WDL  Suicidal Thoughts:  No  Homicidal Thoughts:  No  Memory:  Immediate;   Good Recent;   Good Remote;   Good  Judgement:  Good  Insight:  Good  Psychomotor Activity:  Normal  Concentration:  Concentration: Good and Attention Span: Good  Recall:  Good  Fund of Knowledge:  Good  Language:  Good  Akathisia:  No  Handed:  Right  AIMS (if indicated):     Assets:  Communication Skills Desire for Improvement Financial Resources/Insurance Housing Physical Health Social Support Transportation  ADL's:  Intact  Cognition:  WNL  Sleep:  Number of Hours: 6   Problems Addressed: MDD severe  Treatment Plan Summary: Daily contact with patient to assess and evaluate symptoms  and progress in treatment, Medication management and Plan is to:  -Encourage medication management  -Continue Vistaril 25 mg PO QHS PRN for sedation -Continue Trazodone 50 mg PO QHS PRN for insomnia -Encourage group therapy  participation  Lewis Shock, FNP 02/16/2017, 3:06 PM   Agree with NP Progress Note

## 2017-02-16 NOTE — Tx Team (Signed)
Interdisciplinary Treatment and Diagnostic Plan Update  02/16/2017 Time of Session: 1030 Bobby Sullivan MRN: [id number]  Principal Diagnosis: <principal problem not specified>  Secondary Diagnoses: Active Problems:   MDD (major depressive disorder)   Current Medications:  Current Facility-Administered Medications  Medication Dose Route Frequency Provider Last Rate Last Dose  . acetaminophen (TYLENOL) tablet 650 mg  650 mg Oral Q6H PRN Oneta RackLewis, Tanika N, NP      . alum & mag hydroxide-simeth (MAALOX/MYLANTA) 200-200-20 MG/5ML suspension 30 mL  30 mL Oral Q4H PRN Oneta RackLewis, Tanika N, NP      . hydrOXYzine (ATARAX/VISTARIL) tablet 25 mg  25 mg Oral TID PRN Oneta RackLewis, Tanika N, NP   25 mg at 02/15/17 2111  . magnesium hydroxide (MILK OF MAGNESIA) suspension 30 mL  30 mL Oral Daily PRN Oneta RackLewis, Tanika N, NP      . traZODone (DESYREL) tablet 50 mg  50 mg Oral QHS PRN Oneta RackLewis, Tanika N, NP       PTA Medications: No medications prior to admission.    Patient Stressors: Other: insomnia  Patient Strengths: Ability for insight Average or above average intelligence Capable of independent living General fund of knowledge Motivation for treatment/growth  Treatment Modalities: Medication Management, Group therapy, Case management,  1 to 1 session with clinician, Psychoeducation, Recreational therapy.   Physician Treatment Plan for Primary Diagnosis: <principal problem not specified> Long Term Goal(s): Improvement in symptoms so as ready for discharge Improvement in symptoms so as ready for discharge   Short Term Goals: Ability to verbalize feelings will improve Ability to disclose and discuss suicidal ideas Ability to demonstrate self-control will improve Ability to identify and develop effective coping behaviors will improve Ability to maintain clinical measurements within normal limits will improve Ability to identify changes in lifestyle to reduce recurrence of condition will improve Ability to  maintain clinical measurements within normal limits will improve  Medication Management: Evaluate patient's response, side effects, and tolerance of medication regimen.  Therapeutic Interventions: 1 to 1 sessions, Unit Group sessions and Medication administration.  Evaluation of Outcomes: Progressing  Physician Treatment Plan for Secondary Diagnosis: Active Problems:   MDD (major depressive disorder)  Long Term Goal(s): Improvement in symptoms so as ready for discharge Improvement in symptoms so as ready for discharge   Short Term Goals: Ability to verbalize feelings will improve Ability to disclose and discuss suicidal ideas Ability to demonstrate self-control will improve Ability to identify and develop effective coping behaviors will improve Ability to maintain clinical measurements within normal limits will improve Ability to identify changes in lifestyle to reduce recurrence of condition will improve Ability to maintain clinical measurements within normal limits will improve     Medication Management: Evaluate patient's response, side effects, and tolerance of medication regimen.  Therapeutic Interventions: 1 to 1 sessions, Unit Group sessions and Medication administration.  Evaluation of Outcomes: Progressing   RN Treatment Plan for Primary Diagnosis: <principal problem not specified> Long Term Goal(s): Knowledge of disease and therapeutic regimen to maintain health will improve  Short Term Goals: Ability to identify and develop effective coping behaviors will improve and Compliance with prescribed medications will improve  Medication Management: RN will administer medications as ordered by provider, will assess and evaluate patient's response and provide education to patient for prescribed medication. RN will report any adverse and/or side effects to prescribing provider.  Therapeutic Interventions: 1 on 1 counseling sessions, Psychoeducation, Medication administration,  Evaluate responses to treatment, Monitor vital signs and CBGs as ordered, Perform/monitor CIWA, COWS,  AIMS and Fall Risk screenings as ordered, Perform wound care treatments as ordered.  Evaluation of Outcomes: Progressing   LCSW Treatment Plan for Primary Diagnosis: <principal problem not specified> Long Term Goal(s): Safe transition to appropriate next level of care at discharge, Engage patient in therapeutic group addressing interpersonal concerns.  Short Term Goals: Engage patient in aftercare planning with referrals and resources, Increase social support and Increase skills for wellness and recovery  Therapeutic Interventions: Assess for all discharge needs, 1 to 1 time with Social worker, Explore available resources and support systems, Assess for adequacy in community support network, Educate family and significant other(s) on suicide prevention, Complete Psychosocial Assessment, Interpersonal group therapy.  Evaluation of Outcomes: Progressing   Progress in Treatment: Attending groups: Yes. Participating in groups: Yes. Taking medication as prescribed: Yes. Toleration medication: Yes. Family/Significant other contact made: No, will contact:  mother Patient understands diagnosis: Yes. Discussing patient identified problems/goals with staff: Yes. Medical problems stabilized or resolved: Yes. Denies suicidal/homicidal ideation: Yes. Issues/concerns per patient self-inventory: No. Other: none  New problem(s) identified: No, Describe:  none  New Short Term/Long Term Goal(s):  Discharge Plan or Barriers:   Reason for Continuation of Hospitalization: Depression Medication stabilization  Estimated Length of Stay: 3-5 days.  Attendees: Patient:Bobby Sullivan 02/16/2017   Physician: Dr Jama Flavors, MD 02/16/2017   Nursing: Waynetta Sandy, RN 02/16/2017   RN Care Manager: 02/16/2017   Social Worker: Daleen Squibb 02/16/2017   Recreational Therapist:  02/16/2017   Other:  02/16/2017   Other:   02/16/2017   Other: 02/16/2017      Scribe for Treatment Team: Lorri Frederick, LCSW 02/16/2017 11:18 AM

## 2017-02-16 NOTE — Plan of Care (Signed)
Patient's sleep has improved. He interacting well in unit activities and taking his medications as prescribed.

## 2017-02-16 NOTE — Progress Notes (Signed)
Data. Patient denies SI/HI/AVH. Verbally contracts for safety on the unit and to come to staff before acting of any self harm thoughts/feelings.  Patient interacting well with staff and other patients. Patient's affect has been bright this shift and his mood has been relaxed and calm. On his self assessment patient reports 0/10 for depression, anxiety and hopelessness. His goal for today is, ""Relax." Action. Emotional support and encouragement offered. Education provided on medication, indications and side effect. Q 15 minute checks done for safety. Response. Safety on the unit maintained through 15 minute checks.  Medications taken as prescribed. Attended groups. Remained calm and appropriate through out shift.

## 2017-02-16 NOTE — [facility] Group Notes (Signed)
Adult Psychoeducational Group Note  Date:  02/16/2017 Time:  1:11 AM  Group Topic/Focus:  Wrap-Up Group:   The focus of this group is to help patients review their daily goal of treatment and discuss progress on daily workbooks.  Participation Level:  Active  Participation Quality:  Appropriate and Attentive  Affect:  Appropriate  Cognitive:  Alert and Appropriate  Insight: Appropriate and Good  Engagement in Group:  Engaged  Modes of Intervention:  Discussion and Education  Additional Comments:  Pt attended and participated in wrap up group this evening. Pt told Clinical research associatewriter that they had a good day because they played basketball l by themselves and they also saw some good movies. Pt goal was to meditate by themselves, and even though it was hard they still got through it. A positive noted by the pt was that they ate good and they played chess with other pt.   Chrisandra NettersOctavia A Ayse Mccartin 02/16/2017, 1:11 AM

## 2017-02-16 NOTE — Progress Notes (Signed)
Nursing Progress Note: 7p-7a D: Pt currently presents with a anxious/pleasant affect and behavior. Pt states "I am worried that the pill form of vistaril may not be as effective as the capsule form. I had a great day. I feel fantastic." Interacting appropriately with the milieu. Pt reports fair sleep during the previous night with current medication regimen. Pt did attend wrap-up group.  A: Pt provided with medications per providers orders. Pt's labs and vitals were monitored throughout the night. Pt supported emotionally and encouraged to express concerns and questions. Pt educated on medications.  R: Pt's safety ensured with 15 minute and environmental checks. Pt currently denies SI, HI, and AVH. Pt verbally contracts to seek staff if SI,HI, or AVH occurs and to consult with staff before acting on any harmful thoughts. Will continue to monitor.

## 2017-02-16 NOTE — [facility] Group Notes (Signed)
[facility] 02/16/2017 1:15pm  Type of Therapy: Mental Health Association Presentation  Participation Level: Active  Participation Quality: Attentive  Affect: Appropriate  Cognitive: Oriented  Insight: Developing/Improving  Engagement in Therapy: Engaged  Modes of Intervention: Discussion, Education and Socialization  Summary of Progress/Problems: Mental Health Association (MHA) Speaker came to talk about his personal journey with mental health. The pt processed ways by which to relate to the speaker. MHA speaker provided handouts and educational information pertaining to groups and services offered by the [facility]. Pt was engaged in speaker's presentation and was receptive to resources provided.    Lorri FrederickWierda, Anicka Stuckert Jon, LCSW 02/16/2017 3:39 PM

## 2017-02-16 NOTE — Progress Notes (Signed)
Recreation Therapy Notes  Date: 02/16/17 Time: 0930 Location: 300 Hall Dayroom  Group Topic: Stress Management  Goal Area(s) Addresses:  Patient will verbalize importance of using healthy stress management.  Patient will identify positive emotions associated with healthy stress management.   Intervention: Stress Management  Activity : Guided Imagery.  LRT introduced the stress management technique of guided imagery.  Patients were to follow along as LRT read a script on letting go of unnecessary baggage in order to embrace a new beginning.  Education: Stress Management, Discharge Planning.   Education Outcome: Acknowledges edcuation/In group clarification offered/Needs additional education  Clinical Observations/Feedback: Pt did not attend group.     Dhanya Bogle, LRT/CTRS          Rc Amison A 02/16/2017 11:27 AM 

## 2017-02-16 NOTE — [facility] Suicide Risk Assessment (Signed)
[facility] INPATIENT:  Family/Significant Other Suicide Prevention Education  Suicide Prevention Education:  Contact Attempts: Bobby GowerRhonda Sullivan, mother, [phone], has been identified by the patient as the family member/significant other with whom the patient will be residing, and identified as the person(s) who will aid the patient in the event of a mental health crisis.  With written consent from the patient, two attempts were made to provide suicide prevention education, prior to and/or following the patient's discharge.  We were unsuccessful in providing suicide prevention education.  A suicide education pamphlet was given to the patient to share with family/significant other.  Date and time of first attempt:02/16/17, 1132 Date and time of second attempt:  Bobby Sullivan, Bobby Canova Jon, LCSW 02/16/2017, 11:32 AM

## 2017-02-16 NOTE — [facility] Group Notes (Signed)
Adult Psychoeducational Group Note  Date:  02/16/2017 Time:  11:44 PM  Group Topic/Focus:  Wrap-Up Group:   The focus of this group is to help patients review their daily goal of treatment and discuss progress on daily workbooks.  Participation Level:  Active  Participation Quality:  Appropriate, Inattentive and Redirectable  Affect:  Appropriate  Cognitive:  Alert and Appropriate  Insight: Appropriate and Good  Engagement in Group:  Limited  Modes of Intervention:  Discussion and Education  Additional Comments:  Pt attended and participated in wrap up group this evening. Pt had a good day because they watched movies and drank coffee. The pt goal was to relaxm so they did meditation, even though it was painful.   Chrisandra NettersOctavia A Gem Conkle 02/16/2017, 11:44 PM

## 2017-02-16 NOTE — [facility] Suicide Risk Assessment (Signed)
[facility] INPATIENT:  Family/Significant Other Suicide Prevention Education  Suicide Prevention Education:  Education Completed; Claris GowerRhonda Rosser, mother, [phone], has been identified by the patient as the family member/significant other with whom the patient will be residing, and identified as the person(s) who will aid the patient in the event of a mental health crisis (suicidal ideations/suicide attempt).  With written consent from the patient, the family member/significant other has been provided the following suicide prevention education, prior to the and/or following the discharge of the patient.  The suicide prevention education provided includes the following:  Suicide risk factors  Suicide prevention and interventions  National Suicide Hotline telephone number  [facility] assessment telephone number  [facility] Assistance 911  [facility] and/or Residential Mobile Crisis Unit telephone number  Request made of family/significant other to:  Remove weapons (e.g., guns, rifles, knives), all items previously/currently identified as safety concern.  No guns in the home, per [place]onda.  Remove drugs/medications (over-the-counter, prescriptions, illicit drugs), all items previously/currently identified as a safety concern.  The family member/significant other verbalizes understanding of the suicide prevention education information provided.  The family member/significant other agrees to remove the items of safety concern listed above.  Bjorn LoserRhonda reports that pt has been using Unisom/Caffiene Pills/Bendryl maybe once a month in a large amount.  He did this in December prior to this most recent incident.  She thinks it was something that they did in [employer].  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  She is willing to come to family meeting tomorrow at 11am.    Lorri FrederickWierda, Sawyer Kahan Jon.  LCSW 02/16/2017, 1:41 PM

## 2017-02-17 DIAGNOSIS — T50904A Poisoning by unspecified drugs, medicaments and biological substances, undetermined, initial encounter: Secondary | ICD-10-CM

## 2017-02-17 DIAGNOSIS — Z62810 Personal history of physical and sexual abuse in childhood: Secondary | ICD-10-CM

## 2017-02-17 MED ORDER — NICOTINE POLACRILEX 2 MG MT GUM: 2.0000 mg | CHEWING_GUM | OROMUCOSAL | Status: DC | PRN

## 2017-02-17 MED ORDER — MIRTAZAPINE 7.5 MG PO TABS
7.5000 mg | ORAL_TABLET | Freq: Every day | ORAL | Status: DC
  Administered 2017-02-17: 7.5 mg via ORAL
  Filled 2017-02-17: qty 1
  Filled 2017-02-17: qty 7
  Filled 2017-02-17 (×2): qty 1
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
## 2017-02-17 NOTE — Progress Notes (Signed)
Patient ID: Bobby Sullivan, male   DOB: January 05, 1991, 27 y.o.   MRN: [id number]  Pt currently presents with a masked affect and superficial, fidgety behavior. Pt denies any current concerns to Clinical research associatewriter. Responds to writers serious questions about SI and HI with sarcastic answers. Pt interacts with peers. Pt reports good sleep with current medication regimen.   Pt provided with medications per providers orders. Pt's labs and vitals were monitored throughout the night. Pt given a 1:1 about emotional and mental status. Pt supported and encouraged to express concerns and questions. Pt educated on medications.  Pt's safety ensured with 15 minute and environmental checks. Pt currently denies SI/HI and A/V hallucinations. Pt verbally agrees to seek staff if SI/HI or A/VH occurs and to consult with staff before acting on any harmful thoughts. Will continue POC.

## 2017-02-17 NOTE — Progress Notes (Signed)
DAR NOTE: Patient presents with calm affect and pleasant mood.  Denies pain, auditory and visual hallucinations.  Described energy level as normal and concentration as good.  Rates depression at 0, hopelessness at 0, and anxiety at 0.  Maintained on routine safety checks.  Medications given as prescribed.  Support and encouragement offered as needed.  Attended group and participated.  States goal for today is "relax again."  Patient observed socializing with peers in the dayroom.  Offered no complaint.

## 2017-02-17 NOTE — [facility] Group Notes (Signed)
[facility] LCSW Group Therapy Note  Date/Time: 02/17/17, 1315  Type of Therapy/Topic:  Group Therapy:  Balance in Life  Participation Level:  moderate  Description of Group:    This group will address the concept of balance and how it feels and looks when one is unbalanced. Patients will be encouraged to process areas in their lives that are out of balance, and identify reasons for remaining unbalanced. Facilitators will guide patients utilizing problem- solving interventions to address and correct the stressor making their life unbalanced. Understanding and applying boundaries will be explored and addressed for obtaining  and maintaining a balanced life. Patients will be encouraged to explore ways to assertively make their unbalanced needs known to significant others in their lives, using other group members and facilitator for support and feedback.  Therapeutic Goals: 1. Patient will identify two or more emotions or situations they have that consume much of in their lives. 2. Patient will identify signs/triggers that life has become out of balance:  3. Patient will identify two ways to set boundaries in order to achieve balance in their lives:  4. Patient will demonstrate ability to communicate their needs through discussion and/or role plays  Summary of Patient Progress:Pt identified financial, and physical as areas of life that are out of balance.  Pt participated in group discussion regarding how to identify and take steps in the right direction when life gets out of balance.           Therapeutic Modalities:   Cognitive Behavioral Therapy Solution-Focused Therapy Assertiveness Training  Daleen SquibbGreg Alaycia Eardley, [place]
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
## 2017-02-17 NOTE — [facility] Group Notes (Signed)
[facility] Group Notes:  (Nursing/MHT/Case Management/Adjunct)  Date:  02/17/2017  Time:  1615  Type of Therapy:  Nurse Education - Suicide Safety Plan  Participation Level:  None  Participation Quality:  Drowsy  Affect:  Blunted and Resistant  Cognitive:  Oriented  Insight:  Lacking  Engagement in Group:  None  Modes of Intervention:  Education and Support  Summary of Progress/Problems: Patient attended group and completed SSP (copy on chart) however did not contribute to discussion. Instead, put feet up, closed eyes and dozed off.  Lawrence MarseillesFriedman, Vergil Burby Eakes 02/17/2017, 6:56 PM

## 2017-02-17 NOTE — [facility] Group Notes (Signed)
Pt did not attend group. 

## 2017-02-18 DIAGNOSIS — T450X4A Poisoning by antiallergic and antiemetic drugs, undetermined, initial encounter: Secondary | ICD-10-CM

## 2017-02-18 DIAGNOSIS — R000 Tachycardia, unspecified: Secondary | ICD-10-CM

## 2017-02-18 MED ORDER — MIRTAZAPINE 7.5 MG PO TABS: 7.5000 mg | ORAL_TABLET | Freq: Every day | ORAL | 0 refills | Status: DC

## 2017-02-18 NOTE — [facility] Group Notes (Signed)
LCSW Group Therapy Note 02/18/2017 4:23 PM  Type of Therapy and Topic: Group Therapy: Feelings around Relapse and Recovery  Participation Level: Active   Description of Group:  Patients in this group will discuss emotions they experience before and after a relapse. They will process how experiencing these feelings, or avoidance of experiencing them, relates to having a relapse. Facilitator will guide patients to explore emotions they have related to recovery. Patients will be encouraged to process which emotions are more powerful. They will be guided to discuss the emotional reaction significant others in their lives may have to their relapse or recovery. Patients will be assisted in exploring ways to respond to the emotions of others without this contributing to a relapse.  Therapeutic Goals: 1. Patient will identify two or more emotions that lead to a relapse for them 2. Patient will identify two emotions that result when they relapse 3. Patient will identify two emotions related to recovery 4. Patient will demonstrate ability to communicate their needs through discussion and/or role plays  Summary of Patient Progress:   Bobby Sullivan was engaged during the entire group session. He particpated and contributed to the group's discussion about relapse.    Therapeutic Modalities:  Cognitive Behavioral Therapy Solution-Focused Therapy Assertiveness Training Relapse Prevention Therapy   Alcario DroughtJolan Antaniya Venuti LCSWA Clinical Social Worker

## 2017-02-18 NOTE — Progress Notes (Signed)
Nursing Discharge Note :Discharge instructions/medications/follow up appointments discussed with pt. Prescriptions given, samples given, and patients belongings returned to pt.   Pt verbalizes understanding.  Pt denies SI/HI/AVH. Pt states he just has a problem with sleep and now it's better, continues to minimize his issues. Pt agrees to follow up and take medications as prescribed. Mother reports she was to tired to pick up pt and had neighbor pick up pt.

## 2017-02-18 NOTE — Progress Notes (Signed)
  [facility] :  Will you be returning to the same living situation after discharge:  Yes,  with mother  At discharge, do you have transportation home?: Yes,  mother  Do you have the ability to pay for your medications: Yes,  Medicaid   Release of information consent forms completed and in the chart;  Patient's signature needed at discharge.  Patient to Follow up at: Follow-up Information    [facility] Go on 02/23/2017.   Specialty:  Behavioral Health Why:  Please attend your discharge appt on Wednesday, 02/23/17, at 10:45am. Contact information: [address] [place] [address] [phone]           Next level of care provider has [facility]:yes  Safety Planning and Suicide Prevention discussed: Yes,  with the patient and his mother   Have you used any form of tobacco in the last 30 days? (Cigarettes, Smokeless Tobacco, Cigars, and/or Pipes): Yes  Has patient been referred to the Quitline?: Patient refused referral  Patient has been referred for addiction treatment: Pt. refused referral  Maeola SarahJolan E Shadaya Marschner, LCSWA 02/18/2017, 11:28 AM

## 2017-02-18 NOTE — Discharge Instructions (Addendum)
[facility] [phone]

## 2017-02-18 NOTE — [facility] Suicide Risk Assessment (Addendum)
[facility] Discharge Suicide Risk Assessment   Principal Problem: Major depressive disorder, single episode, severe [facility]) Discharge Diagnoses:  Patient Active Problem List   Diagnosis Date Noted  . MDD (major depressive disorder) [F32.9] 02/14/2017  . Overdose [T50.901A] 02/13/2017  . Major depressive disorder, single episode, severe (HCC) [F32.2] 02/13/2017  . Generalized anxiety disorder [F41.1] 08/03/2015  . Seizure disorder (HCC) [G40.909]   . Leukocytosis [D72.829] 12/16/2014  . Diphenhydramine overdose [T45.0X1A] 12/16/2014  . Sinus tachycardia [R00.0] 12/16/2014  . Prolonged Q-T interval on ECG [R94.31] 12/16/2014  . Insomnia, uncontrolled [G47.00] 08/07/2014  . Diphenhydramine overdose of undetermined intent [T45.0X4A] 08/06/2014  . Asperger's syndrome [F84.5] 08/06/2014  . Adjustment disorder with emotional disturbance [F43.29] 06/15/2013    Total Time spent with patient: 30 minutes  Musculoskeletal: Strength & Muscle Tone: within normal limits Gait & Station: normal Patient leans: N/A  Psychiatric Specialty Exam: ROS denies headache, no chest pain, no shortness of breath, no vomiting   Blood pressure 122/65, pulse 81, temperature (!) 97.3 F (36.3 C), temperature source Oral, resp. rate 16, height 5\' 10"  (1.778 m), weight 76.2 kg (168 lb).Body mass index is 24.11 kg/m.  General Appearance: Fairly Groomed  Patent attorney::  Good  Speech:  Normal Rate409  Volume:  Normal  Mood:  denies depression, states mood "OK", presents euthymic  Affect:  Appropriate and reactive   Thought Process:  Linear and Descriptions of Associations: Intact  Orientation:  Other:  fully alert and attentive   Thought Content:  denies hallucinations, no delusions, not internally preoccupied   Suicidal Thoughts:  No denies any suicidal or self injurious ideations, no homicidal or violent ideations  Homicidal Thoughts:  No  Memory:  recent and remote grossly intact   Judgement:  Other:  improving    Insight:  fair- improving   Psychomotor Activity:  Normal  Concentration:  Good  Recall:  Good  Fund of Knowledge:Good  Language: Good  Akathisia:  Negative  Handed:  Right  AIMS (if indicated):     Assets:  Communication Skills Desire for Improvement Physical Health Resilience  Sleep:  Number of Hours: 6.25  Cognition: WNL  ADL's:  Intact   Mental Status Per Nursing Assessment::   On Admission:     Demographic Factors:  27 year old single male, lives with mother and a brother, employed   Loss Factors: Denies specific triggers or losses, states recent overdose was accidental  Historical Factors: History of prior overdoses on antihistamine medication, which he states have not been suicidal but in an effort to get sleep. History of Asperger's diagnosis as a child  Risk Reduction Factors:   Resilience, support network, employed   Continued Clinical Symptoms:  Patient alert , attentive, pleasant on approach, mood euthymic, denies depression, affect appropriate, reactive, no thought disorder, denies suicidal or self injurious ideations, denies homicidal or violent ideations, no psychotic symptoms , future oriented , planning on returning to work early next week. Denies medication side effects. Currently on Remeron , which he states he is tolerating well, denies side effects, and states it helped him sleep better. Currently OFF antihistamine medications .Patient expresses improving insight and expresses  motivation to avoid using OTC /antihistamine medications , and states he has agreed that his mother will keep his prescribed medications for him and provide him with daily dose .   Cognitive Features That Contribute To Risk:  No gross cognitive deficits noted upon discharge. Is alert , attentive, and oriented x 3    Suicide Risk:  Mild:  Suicidal ideation of limited frequency, intensity, duration, and specificity.  There are no identifiable plans, no associated intent, mild  dysphoria and related symptoms, good self-control (both objective and subjective assessment), few other risk factors, and identifiable protective factors, including available and accessible social support.  Follow-up Information    [facility]. Go on 02/23/2017.   Specialty:  Behavioral Health Why:  Please attend your discharge appt on Wednesday, 02/23/17, at 10:45am. Contact information: [address] [place] [address] [phone]         Plan Of Care/Follow-up recommendations:  Activity:  as tolerated  Diet:  regular Tests:  NA Other:  See below  Patient is expressing improvement and readiness for discharge- leaving unit in good spirits  Plans to return home - states mother will be picking him up later today  Plans to follow up as above  Patient encouraged to consider following at [facility] due to history of chronic insomnia. I have contacted Center- their requirement is that referral be made via PCP- patient aware and states he will follow up on this .  Craige CottaFernando A Rilley Poulter, MD 02/18/2017, 11:26 AM

## 2017-02-18 NOTE — Progress Notes (Signed)
Recreation Therapy Notes  Date: 02/18/17 Time: 0930 Location: [address] Dayroom  Group Topic: Stress Management  Goal Area(s) Addresses:  Patient will verbalize importance of using healthy stress management.  Patient will identify positive emotions associated with healthy stress management.   Intervention: Stress Management  Activity : Progressive Muscle Relaxation.  LRT introduced the stress management technique of progressive muscle relaxation.  LRT led patients through the technique which allowed them to tense and relax each muscle group individually.  Education:  Stress Management, Discharge Planning.   Education Outcome: Acknowledges edcuation/In group clarification offered/Needs additional education  Clinical Observations/Feedback: Pt did not attend group.     Ronnae Kaser, LRT/CTRS         Orian Figueira A 02/18/2017 11:02 AM 

## 2017-02-18 NOTE — Discharge Summary (Signed)
Physician Discharge Summary Note  Patient:  Bobby Sullivan is an 27 y.o., male MRN:  [id number] DOB:  1990/05/31 Patient phone:  [phone] (home)  Patient address:   [address] [place] [address],  Total Time spent with patient: 20 minutes  Date of Admission:  02/14/2017 Date of Discharge: 02/18/17  Reason for Admission:  Overdose  Principal Problem: Major depressive disorder, single episode, severe [facility]) Discharge Diagnoses: Patient Active Problem List   Diagnosis Date Noted  . MDD (major depressive disorder) [F32.9] 02/14/2017  . Overdose [T50.901A] 02/13/2017  . Major depressive disorder, single episode, severe (HCC) [F32.2] 02/13/2017  . Generalized anxiety disorder [F41.1] 08/03/2015  . Seizure disorder (HCC) [G40.909]   . Leukocytosis [D72.829] 12/16/2014  . Diphenhydramine overdose [T45.0X1A] 12/16/2014  . Sinus tachycardia [R00.0] 12/16/2014  . Prolonged Q-T interval on ECG [R94.31] 12/16/2014  . Insomnia, uncontrolled [G47.00] 08/07/2014  . Diphenhydramine overdose of undetermined intent [T45.0X4A] 08/06/2014  . Asperger's syndrome [F84.5] 08/06/2014  . Adjustment disorder with emotional disturbance [F43.29] 06/15/2013    Past Psychiatric History: history of several prior overdoses on Benadryl , most recently in December 2018. One prior admission at age 1, but does not remember reason. States that all of these have been accidental and with an intent of getting sleep, denies any suicidal intent. States he has never attempted suicide, denies history of self cutting. States he has been diagnosed with Asperger's and with ADHD as a child , but states " honestly I think I have grown out of it as I got older ". Describes history of anxiety, described as excessive worrying, and describes long history of insomnia. Denies history of psychosis. Denies history of mania, denies history of significant depressive episodes . Denies history of violence . States he had a sleep  study done about a year ago with no positive findings that he knows of .    Past Medical History:  Past Medical History:  Diagnosis Date  . Anxiety   . Aspergers' syndrome   . Insomnia    History reviewed. No pertinent surgical history. Family History:  Family History  Problem Relation Age of Onset  . Bipolar disorder Mother    Family Psychiatric  History: mother has history of bipolar disorder, and brother has history of schizophrenia. No suicides in family   Social History:  Social History   Substance and Sexual Activity  Alcohol Use Yes   Comment: occasional     Social History   Substance and Sexual Activity  Drug Use Yes   Comment: Per triple C, benadryl, duster     Social History   Socioeconomic History  . Marital status: Married    Spouse name: None  . Number of children: None  . Years of education: None  . Highest education level: None  Social Needs  . Financial resource strain: None  . Food insecurity - worry: None  . Food insecurity - inability: None  . Transportation needs - medical: None  . Transportation needs - non-medical: None  Occupational History  . None  Tobacco Use  . Smoking status: Current Every Day Smoker    Packs/day: 0.10    Types: Cigarettes  . Smokeless tobacco: Never Used  Substance and Sexual Activity  . Alcohol use: Yes    Comment: occasional  . Drug use: Yes    Comment: Per triple C, benadryl, duster   . Sexual activity: None  Other Topics Concern  . None  Social History Narrative  . None  Hospital Course:   02/15/17 [facility] MD Assessment: 27 year old male, who presented to ED on 1/19 following overdose on antihistamine ( Unisom). Patient states that this was not a suicidal attempt, but rather an accidental overdose in the context of " trying to get some sleep". States he has a long history of insomnia. States " I think I took four tablets  " . States that after overdose he felt dizzy, " sick", nauseous, and told his mother ,  leading to being brought to hospital.   Patient states he does not feel he has been depressed, and denies any suicidal or self injurious ideations.  He does endorse significant anxiety, which he characterizes mostly as excessive worrying . Denies neuro-vegetative symptoms of depression , as below, except for insomnia, which he states is chronic. Denies anhedonia, denies changes in appetite or energy level.  Due to prolonged QTc and tachycardia he was initially admitted to inpatient medical unit . *Patient stresses that " my major problem is insomnia", and states he has a long history of " just being unable to go to sleep". Does not feel insomnia is related to depression or mood symptoms. He does state he slept better last night .   Patient remained on the [facility] unit for 3 days and stabilized. Patient was refusing any medications for depression and anxiety. He continued to deny any SI/HI/AVH and contracts for safety. Patient agreed to family meeting and while meeting with mother and provider patient showed more insight and made arrangements with his mother and was started on Remeron 7.5 mg QHS. Patient showed improvement with improved mood, affect, sleep, appetite, and interaction. He was seen in the day room interacting appropriately. Patient agrees to follow up at   [facility] and he agrees to be treatment compliant. Patient is provided with prescriptions and samples of his medications upon discharge.   Physical Findings: AIMS: Facial and Oral Movements Muscles of Facial Expression: None, normal Lips and Perioral Area: None, normal Jaw: None, normal Tongue: None, normal,Extremity Movements Upper (arms, wrists, hands, fingers): None, normal Lower (legs, knees, ankles, toes): None, normal, Trunk Movements Neck, shoulders, hips: None, normal, Overall Severity Severity of abnormal movements (highest score from questions above): None, normal Incapacitation due to abnormal movements: None, normal Patient's  awareness of abnormal movements (rate only patient's report): No Awareness, Dental Status Current problems with teeth and/or dentures?: No Does patient usually wear dentures?: No  CIWA:    COWS:     Musculoskeletal: Strength & Muscle Tone: within normal limits Gait & Station: normal Patient leans: N/A  Psychiatric Specialty Exam: Physical Exam  Nursing note and vitals reviewed. Constitutional: He is oriented to person, place, and time. He appears well-developed and well-nourished.  Cardiovascular: Normal rate.  Musculoskeletal: Normal range of motion.  Neurological: He is alert and oriented to person, place, and time.  Skin: Skin is warm.    Review of Systems  Constitutional: Negative.   HENT: Negative.   Eyes: Negative.   Respiratory: Negative.   Cardiovascular: Negative.   Gastrointestinal: Negative.   Genitourinary: Negative.   Musculoskeletal: Negative.   Skin: Negative.   Neurological: Negative.   Endo/Heme/Allergies: Negative.   Psychiatric/Behavioral: Negative.     Blood pressure 122/65, pulse 81, temperature (!) 97.3 F (36.3 C), temperature source Oral, resp. rate 16, height 5\' 10"  (1.778 m), weight 76.2 kg (168 lb).Body mass index is 24.11 kg/m.  General Appearance: Casual  Eye Contact:  Good  Speech:  Clear and Coherent and Normal Rate  Volume:  Normal  Mood:  Euthymic  Affect:  Congruent  Thought Process:  Goal Directed and Descriptions of Associations: Intact  Orientation:  Full (Time, Place, and Person)  Thought Content:  WDL  Suicidal Thoughts:  No  Homicidal Thoughts:  No  Memory:  Immediate;   Good Recent;   Good Remote;   Good  Judgement:  Good  Insight:  Fair  Psychomotor Activity:  Normal  Concentration:  Concentration: Good and Attention Span: Good  Recall:  Good  Fund of Knowledge:  Good  Language:  Good  Akathisia:  No  Handed:  Right  AIMS (if indicated):     Assets:  Communication Skills Desire for Improvement Financial  Resources/Insurance Housing Physical Health Social Support Transportation  ADL's:  Intact  Cognition:  WNL  Sleep:  Number of Hours: 6.25     Have you used any form of tobacco in the last 30 days? (Cigarettes, Smokeless Tobacco, Cigars, and/or Pipes): Yes  Has this patient used any form of tobacco in the last 30 days? (Cigarettes, Smokeless Tobacco, Cigars, and/or Pipes) Yes, Yes, A prescription for an FDA-approved tobacco cessation medication was offered at discharge and the patient refused  Blood Alcohol level:  Lab Results  Component Value Date   ETH <10 02/12/2017   ETH <10 01/14/2017    Metabolic Disorder Labs:  No results found for: HGBA1C, MPG No results found for: PROLACTIN No results found for: CHOL, TRIG, HDL, CHOLHDL, VLDL, LDLCALC  See Psychiatric Specialty Exam and Suicide Risk Assessment completed by Attending Physician prior to discharge.  Discharge destination:  Home  Is patient on multiple antipsychotic therapies at discharge:  No   Has Patient had three or more failed trials of antipsychotic monotherapy by history:  No  Recommended Plan for Multiple Antipsychotic Therapies: NA   Allergies as of 02/18/2017   No Known Allergies     Medication List    TAKE these medications     Indication  mirtazapine 7.5 MG tablet Commonly known as:  REMERON Take 1 tablet (7.5 mg total) by mouth at bedtime.  Indication:  mood stability      Follow-up Information    [facility]. Go on 02/23/2017.   Specialty:  Behavioral Health Why:  Please attend your discharge appt on Wednesday, 02/23/17, at 10:45am. Contact information: [address] [place] [address] [phone]           Follow-up recommendations:  Continue activity as tolerated. Continue diet as recommended by your PCP. Ensure to keep all appointments with outpatient providers.  Comments:  Patient is instructed prior to discharge to: Take all medications as prescribed by his/her mental healthcare  provider. Report any adverse effects and or reactions from the medicines to his/her outpatient provider promptly. Patient has been instructed & cautioned: To not engage in alcohol and or illegal drug use while on prescription medicines. In the event of worsening symptoms, patient is instructed to call the crisis hotline, 911 and or go to the nearest ED for appropriate evaluation and treatment of symptoms. To follow-up with his/her primary care provider for your other medical issues, concerns and or health care needs.    Signed: Gerlene Burdockravis B Money, FNP 02/18/2017, 8:18 AM   Patient seen, Suicide Assessment Completed.  Disposition Plan Reviewed

## 2017-02-27 ENCOUNTER — Encounter (HOSPITAL_COMMUNITY): Payer: Self-pay

## 2017-02-27 ENCOUNTER — Emergency Department (HOSPITAL_COMMUNITY)
Admission: EM | Admit: 2017-02-27 | Discharge: 2017-02-28 | Disposition: A | Discharge status: Home / Self Care | Payer: Self-pay | Attending: Emergency Medicine | Admitting: Emergency Medicine

## 2017-02-27 ENCOUNTER — Other Ambulatory Visit: Payer: Self-pay

## 2017-02-27 DIAGNOSIS — T450X4A Poisoning by antiallergic and antiemetic drugs, undetermined, initial encounter: Secondary | ICD-10-CM | POA: Insufficient documentation

## 2017-02-27 DIAGNOSIS — F329 Major depressive disorder, single episode, unspecified: Secondary | ICD-10-CM | POA: Insufficient documentation

## 2017-02-27 DIAGNOSIS — F17210 Nicotine dependence, cigarettes, uncomplicated: Secondary | ICD-10-CM | POA: Insufficient documentation

## 2017-02-27 DIAGNOSIS — F419 Anxiety disorder, unspecified: Secondary | ICD-10-CM | POA: Insufficient documentation

## 2017-02-27 DIAGNOSIS — F411 Generalized anxiety disorder: Secondary | ICD-10-CM

## 2017-02-27 DIAGNOSIS — F322 Major depressive disorder, single episode, severe without psychotic features: Secondary | ICD-10-CM

## 2017-02-27 DIAGNOSIS — T50904A Poisoning by unspecified drugs, medicaments and biological substances, undetermined, initial encounter: Secondary | ICD-10-CM

## 2017-02-27 NOTE — ED Triage Notes (Addendum)
Pt came in with [employer] to ER with IVC papers denies SI or HI just took unisom to help him sleep has hx on insomnia. Pt states he is neither SI or HI, just wanted to get a nights rest.

## 2017-02-27 NOTE — ED Notes (Signed)
EDP in room to assess patient at this time. Pt remains cooperative with care.

## 2017-02-27 NOTE — ED Notes (Addendum)
Patient arrived to the unit accompanied by [employer]. Pt pleasant and cooperative with care upon arrival. Pt with no signs of drowsiness. No distress noted. Patient states he took four Unisom sleeping pills "Just to get some rest". Pt vehemently denies that he had any intention of harming himself. The pt states "I guess my mom thought I was high and was worried about me so she called the police". Pt states "I just want to talk to the psychiatrist so I can leave. I am ok now". No inappropriate behaviors noted.

## 2017-02-27 NOTE — ED Notes (Signed)
Patient given specimen cup and is aware of need for urine collection.

## 2017-02-28 LAB — COMPREHENSIVE METABOLIC PANEL
ALBUMIN: 4.7 g/dL (ref 3.5–5.0)
ALT: 25 U/L (ref 17–63)
ANION GAP: 8 (ref 5–15)
AST: 30 U/L (ref 15–41)
Alkaline Phosphatase: 37 U/L — ABNORMAL LOW (ref 38–126)
BUN: 24 mg/dL — ABNORMAL HIGH (ref 6–20)
CO2: 26 mmol/L (ref 22–32)
Calcium: 9.6 mg/dL (ref 8.9–10.3)
Chloride: 105 mmol/L (ref 101–111)
Creatinine, Ser: 1.3 mg/dL — ABNORMAL HIGH (ref 0.61–1.24)
GFR calc non Af Amer: >60 mL/min (ref >60)
GLUCOSE: 113 mg/dL — AB (ref 65–99)
POTASSIUM: 4.2 mmol/L (ref 3.5–5.1)
SODIUM: 139 mmol/L (ref 135–145)
Total Bilirubin: 0.2 mg/dL — ABNORMAL LOW (ref 0.3–1.2)
Total Protein: 7.8 g/dL (ref 6.5–8.1)

## 2017-02-28 LAB — RAPID URINE DRUG SCREEN, HOSP PERFORMED
Amphetamines: NOT DETECTED
BARBITURATES: NOT DETECTED
BENZODIAZEPINES: NOT DETECTED
COCAINE: NOT DETECTED
OPIATES: NOT DETECTED
TETRAHYDROCANNABINOL: NOT DETECTED

## 2017-02-28 LAB — CBC
HCT: 41.6 % (ref 39.0–52.0)
HEMOGLOBIN: 14.3 g/dL (ref 13.0–17.0)
MCH: 31.6 pg (ref 26.0–34.0)
MCHC: 34.4 g/dL (ref 30.0–36.0)
MCV: 91.8 fL (ref 78.0–100.0)
Platelets: 268 10*3/uL (ref 150–400)
RBC: 4.53 MIL/uL (ref 4.22–5.81)
RDW: 13.1 % (ref 11.5–15.5)
WBC: 11.8 10*3/uL — AB (ref 4.0–10.5)

## 2017-02-28 LAB — SALICYLATE LEVEL

## 2017-02-28 LAB — ETHANOL: Alcohol, Ethyl (B): <10 mg/dL (ref <10)

## 2017-02-28 LAB — ACETAMINOPHEN LEVEL: Acetaminophen (Tylenol), Serum: <10 ug/mL — ABNORMAL LOW (ref 10–30)
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
## 2017-02-28 NOTE — Patient Outreach (Signed)
[facility] met with the patient and provided substance use recovery support. Patient is not interested in substance use treatment or support group services at this time. Patent stated that he wanted some resources for homeless shelters and vocational rehab resources, so [facility] provided those resources.  

## 2017-02-28 NOTE — ED Provider Notes (Signed)
[facility]-EMERGENCY DEPT Provider Note   CSN: [id number] Arrival date & time: 02/27/17  2306     History   Chief Complaint Chief Complaint  Patient presents with  . Medical Clearance    HPI Bobby Sullivan is a 27 y.o. male.  Patient is a 27 year old male with past medical history of depression, anxiety, seizures, Asperger's presenting for evaluation of overdose.  The patient's mother called police as he appeared to be somnolent and not behaving appropriately.  She was concerned he may have overdosed.  The patient admits to taking 4 Unisom for the purpose of trying to get to sleep.  He denies any suicidal or homicidal ideation and denies that this was any attempt to harm himself.  He denies any alcohol or illicit drug use.   The history is provided by the patient.  Ingestion  This is a new problem. The current episode started 1 to 2 hours ago. The problem occurs constantly. The problem has not changed since onset.Nothing aggravates the symptoms. Nothing relieves the symptoms. He has tried nothing for the symptoms.    Past Medical History:  Diagnosis Date  . Anxiety   . Aspergers' syndrome   . Insomnia     Patient Active Problem List   Diagnosis Date Noted  . MDD (major depressive disorder) 02/14/2017  . Overdose 02/13/2017  . Major depressive disorder, single episode, severe (HCC) 02/13/2017  . Generalized anxiety disorder 08/03/2015  . Seizure disorder (HCC)   . Leukocytosis 12/16/2014  . Diphenhydramine overdose 12/16/2014  . Sinus tachycardia 12/16/2014  . Prolonged Q-T interval on ECG 12/16/2014  . Insomnia, uncontrolled 08/07/2014  . Diphenhydramine overdose of undetermined intent 08/06/2014  . Asperger's syndrome 08/06/2014  . Adjustment disorder with emotional disturbance 06/15/2013    History reviewed. No pertinent surgical history.     Home Medications    Prior to Admission medications   Medication Sig Start Date End Date  Taking? Authorizing Provider  doxylamine, Sleep, (UNISOM) 25 MG tablet Take 50 mg by mouth at bedtime as needed for sleep.   Yes [provider]  mirtazapine (REMERON) 7.5 MG tablet Take 1 tablet (7.5 mg total) by mouth at bedtime. Patient not taking: Reported on 02/27/2017 02/18/17   Money, Gerlene Burdock, FNP    Family History Family History  Problem Relation Age of Onset  . Bipolar disorder Mother     Social History Social History   Tobacco Use  . Smoking status: Current Every Day Smoker    Packs/day: 0.10    Types: Cigarettes  . Smokeless tobacco: Never Used  Substance Use Topics  . Alcohol use: Yes    Comment: occasional  . Drug use: Yes    Comment: Per triple C, benadryl, duster      Allergies   Patient has no known allergies.   Review of Systems Review of Systems  All other systems reviewed and are negative.    Physical Exam Updated Vital Signs BP 140/87 (BP Location: Right Arm)   Pulse (!) 119   Temp 98.1 F (36.7 C) (Oral)   Resp 20   Ht 5\' 10"  (1.778 m)   Wt 76.2 kg (168 lb)   SpO2 100%   BMI 24.11 kg/m   Physical Exam  Constitutional: He is oriented to person, place, and time. He appears well-developed and well-nourished. No distress.  HENT:  Head: Normocephalic and atraumatic.  Mouth/Throat: Oropharynx is clear and moist.  Eyes: EOM are normal. Pupils are equal, round, and reactive  to light.  Neck: Normal range of motion. Neck supple.  Cardiovascular: Normal rate and regular rhythm. Exam reveals no friction rub.  No murmur heard. Pulmonary/Chest: Effort normal and breath sounds normal. No respiratory distress. He has no wheezes. He has no rales.  Abdominal: Soft. Bowel sounds are normal. He exhibits no distension. There is no tenderness.  Musculoskeletal: Normal range of motion. He exhibits no edema.  Neurological: He is alert and oriented to person, place, and time. No cranial nerve deficit. Coordination normal.  Skin: Skin is warm and dry.  He is not diaphoretic.  Nursing note and vitals reviewed.    ED Treatments / Results  Labs (all labs ordered are listed, but only abnormal results are displayed) Labs Reviewed  COMPREHENSIVE METABOLIC PANEL  ETHANOL  SALICYLATE LEVEL  ACETAMINOPHEN LEVEL  CBC  RAPID URINE DRUG SCREEN, HOSP PERFORMED    EKG  EKG Interpretation None       Radiology No results found.  Procedures Procedures (including critical care time)  Medications Ordered in ED Medications - No data to display   Initial Impression / Assessment and Plan / ED Course  I have reviewed the triage vital signs and the nursing notes.  Pertinent labs & imaging results that were available during my care of the patient were reviewed by me and considered in my medical decision making (see chart for details).  Patient evaluated by TTS who is recommending the patient undergo formal psychiatric consultation in the morning.  Final Clinical Impressions(s) / ED Diagnoses   Final diagnoses:  None    ED Discharge Orders    None       Geoffery Lyonselo, Arrington Bencomo, MD 02/28/17 [phone]

## 2017-02-28 NOTE — BH Assessment (Signed)
[facility] Assessment Progress Note  Case discussed with Nira ConnJason Berry, NP who recommends continued observation and re-evaluation by psychiatry in the AM. EDP Geoffery Lyonselo, Douglas, MD and pt's nurse Marcelino DusterMichelle, RN have been advised of the disposition.  Bobby Sullivan, MSW, LCSW Therapeutic Triage Specialist  [phone]

## 2017-02-28 NOTE — [facility] Suicide Risk Assessment (Signed)
Suicide Risk Assessment  Discharge Assessment   [facility]   Principal Problem: <principal problem not specified> Discharge Diagnoses:  Patient Active Problem List   Diagnosis Date Noted  . MDD (major depressive disorder) [F32.9] 02/14/2017  . Overdose [T50.901A] 02/13/2017  . Major depressive disorder, single episode, severe (HCC) [F32.2] 02/13/2017  . Generalized anxiety disorder [F41.1] 08/03/2015  . Seizure disorder (HCC) [G40.909]   . Leukocytosis [D72.829] 12/16/2014  . Diphenhydramine overdose [T45.0X1A] 12/16/2014  . Sinus tachycardia [R00.0] 12/16/2014  . Prolonged Q-T interval on ECG [R94.31] 12/16/2014  . Insomnia, uncontrolled [G47.00] 08/07/2014  . Diphenhydramine overdose of undetermined intent [T45.0X4A] 08/06/2014  . Asperger's syndrome [F84.5] 08/06/2014  . Adjustment disorder with emotional disturbance [F43.29] 06/15/2013   Pt was seen and chart reviewed with Dr Sharma CovertNorman. Pt stated he took 4 Unisom in an attempt to get some sleep. Pt's UDS positive for benzos and THC, BAL negative.  Pt has been hospitalized before for this same issue. Pt denies suicidal/homicidal ideation, denies auditory/visual hallucinations and does not appear to be responding to internal stimuli. Pt stated he suffers with insomnia and takes Unisom to sleep. Pt lives with his mother and is able to contract for safety. Pt will be seen by Peer Support for resources in the community for substance abuse treatment. Pt is stable and psychiatrically clear for discharge.   Total Time spent with patient: 30 minutes  Musculoskeletal: Strength & Muscle Tone: within normal limits Gait & Station: normal Patient leans: N/A  Psychiatric Specialty Exam:   Blood pressure 116/66, pulse 75, temperature 97.8 F (36.6 C), temperature source Oral, resp. rate 20, height 5\' 10"  (1.778 m), weight 76.2 kg (168 lb), SpO2 99 %.Body mass index is 24.11 kg/m.  General Appearance: Casual  Eye  Contact::  Good  Speech:  Clear and Coherent and Normal Rate409  Volume:  Normal  Mood:  Euthymic  Affect:  Congruent  Thought Process:  Coherent, Goal Directed and Linear  Orientation:  Full (Time, Place, and Person)  Thought Content:  Logical  Suicidal Thoughts:  No  Homicidal Thoughts:  No  Memory:  Immediate;   Good Recent;   Good Remote;   Fair  Judgement:  Fair  Insight:  Fair  Psychomotor Activity:  Normal  Concentration:  Good  Recall:  Good  Fund of Knowledge:Good  Language: Good  Akathisia:  No  Handed:  Right  AIMS (if indicated):     Assets:  ArchitectCommunication Skills Financial Resources/Insurance Housing Vocational/Educational  Sleep:     Cognition: WNL  ADL's:  Intact   Mental Status Per Nursing Assessment::   On Admission:   Under IVC because his family member thought he was trying to overdose.   Demographic Factors:  Male, Adolescent or young adult and Caucasian  Loss Factors: NA  Historical Factors: Impulsivity  Risk Reduction Factors:   Sense of responsibility to family, Employed and Living with another person, especially a relative  Continued Clinical Symptoms:  Severe Anxiety and/or Agitation Depression:   Impulsivity More than one psychiatric diagnosis Previous Psychiatric Diagnoses and Treatments  Cognitive Features That Contribute To Risk:  Closed-mindedness    Suicide Risk:  Minimal: No identifiable suicidal ideation.  Patients presenting with no risk factors but with morbid ruminations; may be classified as minimal risk based on the severity of the depressive symptoms    Plan Of Care/Follow-up recommendations:  Activity:  as tolerated Diet:  heart Healthy  Laveda AbbeLaurie Britton Kyshawn Teal, NP 02/28/2017, 11:07 AM

## 2017-02-28 NOTE — Discharge Instructions (Signed)
For your mental health needs, you are advised to continue treatment with [facility]: ° °     [facility] °     [address] °     [place], [place] [address] °     [phone] °

## 2017-02-28 NOTE — BH Assessment (Signed)
[facility]  Per Juanetta BeetsJacqueline Norman, DO, this pt does not require psychiatric hospitalization at this time.  Pt presents under IVC initiated by a friend of the pt, which Dr Sharma CovertNorman has rescinded.  [facility]  This has been included in pt's discharge instructions.  Pt would also benefit from seeing Peer Support Specialists; they will be asked to speak to pt.  Pt's nurse has been notified.  Doylene Canninghomas Airam Runions, MA Triage Specialist [phone]

## 2017-11-26 ENCOUNTER — Emergency Department (HOSPITAL_COMMUNITY): Payer: Self-pay

## 2017-11-26 ENCOUNTER — Other Ambulatory Visit: Payer: Self-pay

## 2017-11-26 ENCOUNTER — Encounter (HOSPITAL_COMMUNITY): Payer: Self-pay | Admitting: *Deleted

## 2017-11-26 ENCOUNTER — Emergency Department (HOSPITAL_COMMUNITY)
Admission: EM | Admit: 2017-11-26 | Discharge: 2017-11-27 | Disposition: A | Discharge status: Home / Self Care | Payer: Self-pay | Attending: Emergency Medicine | Admitting: Emergency Medicine

## 2017-11-26 DIAGNOSIS — T450X1A Poisoning by antiallergic and antiemetic drugs, accidental (unintentional), initial encounter: Secondary | ICD-10-CM | POA: Insufficient documentation

## 2017-11-26 DIAGNOSIS — F1894 Inhalant use, unspecified with inhalant-induced mood disorder: Secondary | ICD-10-CM | POA: Diagnosis present

## 2017-11-26 DIAGNOSIS — G4700 Insomnia, unspecified: Secondary | ICD-10-CM | POA: Diagnosis present

## 2017-11-26 DIAGNOSIS — F845 Asperger's syndrome: Secondary | ICD-10-CM | POA: Insufficient documentation

## 2017-11-26 DIAGNOSIS — F1824 Inhalant dependence with inhalant-induced mood disorder: Secondary | ICD-10-CM | POA: Insufficient documentation

## 2017-11-26 DIAGNOSIS — F17210 Nicotine dependence, cigarettes, uncomplicated: Secondary | ICD-10-CM | POA: Insufficient documentation

## 2017-11-26 LAB — ACETAMINOPHEN LEVEL: Acetaminophen (Tylenol), Serum: <10 ug/mL — ABNORMAL LOW (ref 10–30)

## 2017-11-26 LAB — CBC
HCT: 45.4 % (ref 39.0–52.0)
Hemoglobin: 15.3 g/dL (ref 13.0–17.0)
MCH: 30.7 pg (ref 26.0–34.0)
MCHC: 33.7 g/dL (ref 30.0–36.0)
MCV: 91 fL (ref 80.0–100.0)
NRBC: 0 % (ref 0.0–0.2)
Platelets: 258 10*3/uL (ref 150–400)
RBC: 4.99 MIL/uL (ref 4.22–5.81)
RDW: 12.5 % (ref 11.5–15.5)
WBC: 9.3 10*3/uL (ref 4.0–10.5)

## 2017-11-26 LAB — POTASSIUM: Potassium: 3.8 mmol/L (ref 3.5–5.1)

## 2017-11-26 LAB — COMPREHENSIVE METABOLIC PANEL
ALT: 21 U/L (ref 0–44)
AST: 25 U/L (ref 15–41)
Albumin: 5.5 g/dL — ABNORMAL HIGH (ref 3.5–5.0)
Alkaline Phosphatase: 36 U/L — ABNORMAL LOW (ref 38–126)
Anion gap: 11 (ref 5–15)
BUN: 12 mg/dL (ref 6–20)
CHLORIDE: 101 mmol/L (ref 98–111)
CO2: 26 mmol/L (ref 22–32)
Calcium: 9.2 mg/dL (ref 8.9–10.3)
Creatinine, Ser: 1.09 mg/dL (ref 0.61–1.24)
Glucose, Bld: 106 mg/dL — ABNORMAL HIGH (ref 70–99)
POTASSIUM: 2.8 mmol/L — AB (ref 3.5–5.1)
SODIUM: 138 mmol/L (ref 135–145)
Total Bilirubin: 0.4 mg/dL (ref 0.3–1.2)
Total Protein: 8.1 g/dL (ref 6.5–8.1)

## 2017-11-26 LAB — RAPID URINE DRUG SCREEN, HOSP PERFORMED
Amphetamines: NOT DETECTED
BENZODIAZEPINES: POSITIVE — AB
Barbiturates: NOT DETECTED
COCAINE: NOT DETECTED
Opiates: NOT DETECTED
Tetrahydrocannabinol: NOT DETECTED

## 2017-11-26 LAB — CBG MONITORING, ED: Glucose-Capillary: 100 mg/dL — ABNORMAL HIGH (ref 70–99)

## 2017-11-26 LAB — ETHANOL

## 2017-11-26 LAB — SALICYLATE LEVEL

## 2017-11-26 MED ORDER — POTASSIUM CHLORIDE CRYS ER 20 MEQ PO TBCR
40.0000 meq | EXTENDED_RELEASE_TABLET | Freq: Once | ORAL | Status: AC
  Administered 2017-11-26: 40 meq via ORAL
  Filled 2017-11-26: qty 2

## 2017-11-26 MED ORDER — POTASSIUM CHLORIDE 10 MEQ/100ML IV SOLN
10.0000 meq | INTRAVENOUS | Status: AC
  Administered 2017-11-26 (×2): 10 meq via INTRAVENOUS
  Filled 2017-11-26 (×2): qty 100
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
## 2017-11-26 NOTE — ED Notes (Signed)
Bed: RESB Expected date:  Expected time:  Means of arrival:  Comments: 27 yo OD

## 2017-11-26 NOTE — ED Notes (Signed)
Poison Control Update: -repeat EKG now and repeat every 4-6 hours  -monitor for seizure activity  -monitor for urinary retention--advised to complete a bladder scan  -monitor bowel sounds   Do not want to clear patient until normal vital signs, EKG, and urinary output

## 2017-11-26 NOTE — ED Notes (Signed)
Spoke with Bobby Sullivan at [facility] for at least 6 hours, repeat EKG prior to clearing. Call back with labs. Observe for seizures, anticholinergic, decreased bowel sounds and U/O. Give IV fluids and supportive care. EKG

## 2017-11-26 NOTE — ED Provider Notes (Signed)
[facility]-EMERGENCY DEPT Provider Note   CSN: [id number] Arrival date & time: 11/26/17  1830     History   Chief Complaint Chief Complaint  Patient presents with  . Ingestion  . Huffing spray paint    HPI Bobby Sullivan is a 27 y.o. male.  HPI Patient is not responding to verbal questioning.  Level 5 caveat applies.  Per EMS, parents called after suspected huffing spray paint and overdosing on unknown amount of Unisom.  Multiple previous visits for overdose. Past Medical History:  Diagnosis Date  . Anxiety   . Aspergers' syndrome   . Insomnia     Patient Active Problem List   Diagnosis Date Noted  . 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 MDD (major depressive disorder) 02/14/2017  . Overdose 02/13/2017  . Major depressive disorder, single episode, severe (HCC) 02/13/2017  . Generalized anxiety disorder 08/03/2015  . Seizure disorder (HCC)   . Leukocytosis 12/16/2014  . Diphenhydramine overdose 12/16/2014  . Sinus tachycardia 12/16/2014  . Prolonged Q-T interval on ECG 12/16/2014  . Insomnia 08/07/2014  . Diphenhydramine overdose of undetermined intent 08/06/2014  . Asperger's syndrome 08/06/2014    History reviewed. No pertinent surgical history.      Home Medications    Prior to Admission medications   Medication Sig Start Date End Date Taking? Authorizing Provider  QUEtiapine (SEROQUEL) 25 MG tablet Take 1 tablet (25 mg total) by mouth at bedtime. 11/27/17   Charm Rings, NP    Family History Family History  Problem Relation Age of Onset  . Bipolar disorder Mother     Social History Social History   Tobacco Use  . Smoking status: Current Every Day Smoker    Packs/day: 0.10    Types: Cigarettes  . Smokeless tobacco: Never Used  Substance Use Topics  . Alcohol use: Yes    Comment: occasional  . Drug use: Yes    Comment: Per triple C, benadryl, duster      Allergies   Patient has no  known allergies.   Review of Systems Review of Systems  Unable to perform ROS: Patient unresponsive     Physical Exam Updated Vital Signs BP 136/76 (BP Location: Left Arm)   Pulse 93   Temp 98.5 F (36.9 C) (Oral)   Resp 18   Ht 5\' 9"  (1.753 m)   Wt 72.6 kg   SpO2 99%   BMI 23.63 kg/m   Physical Exam  Constitutional: He appears well-developed and well-nourished. No distress.  HENT:  Head: Normocephalic and atraumatic.  Mouth/Throat: Oropharynx is clear and moist.  Patient has small amount of presumably black spray paint in the oropharynx.  Eyes: Pupils are equal, round, and reactive to light. EOM are normal.  4 mm and sluggish bilaterally.  Neck: Normal range of motion. Neck supple. No JVD present.  No stridor  Cardiovascular: Regular rhythm.  Tachycardia  Pulmonary/Chest: Effort normal and breath sounds normal. No stridor. No respiratory distress. He has no wheezes. He has no rales. He exhibits no tenderness.  Abdominal: Soft. Bowel sounds are normal. There is no tenderness. There is no rebound and no guarding.  Musculoskeletal: Normal range of motion. He exhibits no edema or tenderness.  Lymphadenopathy:    He has no cervical adenopathy.  Neurological: He is alert.  Patient is not answering questions.  He follows commands.  Moving all extremities without focal deficit.  Sensation intact.  Skin: Skin is warm and dry. Capillary refill  takes less than 2 seconds. No rash noted. He is not diaphoretic. No erythema.  Nursing note and vitals reviewed.    ED Treatments / Results  Labs (all labs ordered are listed, but only abnormal results are displayed) Labs Reviewed  COMPREHENSIVE METABOLIC PANEL - Abnormal; Notable for the following components:      Result Value   Potassium 2.8 (*)    Glucose, Bld 106 (*)    Albumin 5.5 (*)    Alkaline Phosphatase 36 (*)    All other components within normal limits  ACETAMINOPHEN LEVEL - Abnormal; Notable for the following  components:   Acetaminophen (Tylenol), Serum <10 (*)    All other components within normal limits  RAPID URINE DRUG SCREEN, HOSP PERFORMED - Abnormal; Notable for the following components:   Benzodiazepines POSITIVE (*)    All other components within normal limits  CBG MONITORING, ED - Abnormal; Notable for the following components:   Glucose-Capillary 100 (*)    All other components within normal limits  ETHANOL  SALICYLATE LEVEL  CBC  POTASSIUM    EKG EKG Interpretation  Date/Time:  Saturday November 26 2017 21:51:49 EDT Ventricular Rate:  105 PR Interval:    QRS Duration: 89 QT Interval:  348 QTC Calculation: 460 R Axis:   77 Text Interpretation:  Sinus tachycardia RSR' in V1 or V2, probably normal variant No significant change since last tracing Confirmed by Frederick Peers [id number]) on 11/27/2017 4:59:21 PM   Radiology No results found.  Procedures Procedures (including critical care time)  Medications Ordered in ED Medications  potassium chloride 10 mEq in 100 mL IVPB (0 mEq Intravenous Stopped 11/26/17 2252)  potassium chloride SA (K-DUR,KLOR-CON) CR tablet 40 mEq (40 mEq Oral Given 11/26/17 2303)     Initial Impression / Assessment and Plan / ED Course  I have reviewed the triage vital signs and the nursing notes.  Pertinent labs & imaging results that were available during my care of the patient were reviewed by me and considered in my medical decision making (see chart for details).    Poison control called by nursing staff.  Recommended observation for 6 hours and repeat EKG  Signed out to oncoming emergency provider pending reassessment and TTS consultation Final Clinical Impressions(s) / ED Diagnoses   Final diagnoses:  Inhalant use with inhalant-induced mood disorder [facility])    ED Discharge Orders         Ordered    QUEtiapine (SEROQUEL) 25 MG tablet  Daily at bedtime     11/27/17 1337           Loren Racer, MD 11/30/17 1552

## 2017-11-27 DIAGNOSIS — F1894 Inhalant use, unspecified with inhalant-induced mood disorder: Secondary | ICD-10-CM | POA: Diagnosis present

## 2017-11-27 MED ORDER — QUETIAPINE FUMARATE 25 MG PO TABS: 25.0000 mg | ORAL_TABLET | Freq: Every day | ORAL | 0 refills | Status: DC

## 2017-11-27 MED ORDER — QUETIAPINE FUMARATE 25 MG PO TABS: 25.0000 mg | ORAL_TABLET | Freq: Every day | ORAL | Status: DC

## 2017-11-27 MED ORDER — ACETAMINOPHEN 325 MG PO TABS
650.0000 mg | ORAL_TABLET | Freq: Four times a day (QID) | ORAL | Status: DC | PRN
  Administered 2017-11-27: 650 mg via ORAL
  Filled 2017-11-27: qty 2

## 2017-11-27 NOTE — Progress Notes (Signed)
11/27/17  1053  Pt states he does not want to harm himself. Patient also states he does not want to go to [facility].

## 2017-11-27 NOTE — BHH Counselor (Signed)
TTS assessor attempted to see patient to complete assessment. Patient continues to be asleep unable to arise by calling his name. TTS assessor will attempt to assess patient later this date.  

## 2017-11-27 NOTE — ED Provider Notes (Signed)
Contacted by poison control.  They have been following the patient.  They indicate that the patient no longer needs any monitoring, is medically clear for psychiatric evaluation.   Gilda Crease, MD 11/27/17 0030

## 2017-11-27 NOTE — BH Assessment (Signed)
Tele Assessment Note   Patient Name: Bobby Sullivan MRN: [id number] Referring Physician: Gilda Crease, MD Location of Patient: [facility] Location of Provider: [facility]  Bobby Sullivan is an 27 y.o. male present to [facility] after his family called EMS with complaints patient painted himself with black spray paint and huffed, he took an unknown amount of unisom. Patient denied snuffing spray paint or painting himself stating, "I was spray painting the windows in the my room black so it would be dark, no light coming in. I want to play my projector." Patient report he took only 4 pills in an attempt to get to sleep. Patient denies suicidal / homicidal ideations, denies auditory / visual hallucinations. Denies feelings of paranoia. Patient has a mental history of Depression, anxiety, seizures and Asperger's. Patient last seen in the ED 02/2017 with similar complaints of overtaking his unisom. Patient has significant history of ED visits with similar concerns including OD on Benadryl and Unisom in the past.   Dr. 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 Anxiety   . Aspergers' syndrome   . Insomnia     History reviewed. No pertinent surgical history.  Family History:  Family History  Problem Relation Age of Onset  . Bipolar disorder Mother     Social History:  reports that he has been smoking cigarettes. He has been smoking about 0.10 packs per day. He has never used smokeless tobacco. He reports that he drinks alcohol. He reports that he has current or past drug history.  Additional Social History:     CIWA: CIWA-Ar BP: (!) 106/59 Pulse Rate: 63 COWS:    Allergies: No Known Allergies  Home Medications:  (Not in a hospital admission)  OB/GYN Status:  No LMP for male patient.  General Assessment  Data Assessment unable to be completed: Yes Reason for not completing assessment: Multiple assessments ordered simultaneously Location of Assessment: [facility] TTS Assessment: In system Is this a Tele or Face-to-Face Assessment?: Face-to-Face Is this an Initial Assessment or a Re-assessment for this encounter?: Initial Assessment Patient Accompanied by:: N/A Language Other than English: No Living Arrangements: Other (Comment)(mom) What gender do you identify as?: Male Marital status: Single Living Arrangements: Parent Can pt return to current living arrangement?: Yes Admission Status: Voluntary Is patient capable of signing voluntary admission?: Yes Referral Source: Self/Family/Friend Insurance type: self pay      Crisis Care Plan Living Arrangements: Parent Legal Guardian: Other:(self) Name of Psychiatrist: Vesta Mixer Name of Therapist: [facility]   Education Status Is patient currently in school?: No  Risk to self with the past 6 months Suicidal Ideation: No Has patient been a risk to self within the past 6 months prior to admission? : No Suicidal Intent: No Has patient had any suicidal intent within the past 6 months prior to admission? : No Is patient at risk for suicide?: No Suicidal Plan?: No Has patient had any suicidal plan within the past 6 months prior to admission? : No Access to Means: No What has been your use of drugs/alcohol within the last 12 months?: pt denied  Previous Attempts/Gestures: No How many times?: 0 Other Self Harm Risks: none report  Triggers for Past Attempts: None known Intentional Self Injurious Behavior: None Family Suicide History: No Recent stressful life event(s): Other (Comment)(none known ) Persecutory voices/beliefs?: No Depression: No Substance  abuse history and/or treatment for substance abuse?: No Suicide prevention information given to non-admitted patients: Not applicable  Risk to Others within the past 6 months Homicidal  Ideation: No Does patient have any lifetime risk of violence toward others beyond the six months prior to admission? : No Thoughts of Harm to Others: No Current Homicidal Intent: No Current Homicidal Plan: No Access to Homicidal Means: No Identified Victim: n/a History of harm to others?: No Assessment of Violence: None Noted Violent Behavior Description: None Noted Does patient have access to weapons?: No Criminal Charges Pending?: No Does patient have a court date: No Is patient on probation?: No  Psychosis Hallucinations: None noted Delusions: None noted  Mental Status Report Appearance/Hygiene: In scrubs Eye Contact: Fair Motor Activity: Freedom of movement Speech: Logical/coherent Level of Consciousness: Alert Mood: Pleasant Affect: Appropriate to circumstance Anxiety Level: None Thought Processes: Coherent, Relevant Judgement: Unimpaired Orientation: Person, Place, Situation, Time Obsessive Compulsive Thoughts/Behaviors: None  Cognitive Functioning Concentration: Good Memory: Recent Intact, Remote Intact Is patient IDD: No Insight: Fair Impulse Control: Fair Appetite: Good Have you had any weight changes? : No Change Sleep: Decreased Total Hours of Sleep: (patient unsure ) Vegetative Symptoms: None  ADLScreening [facility] Assessment Services) Patient's cognitive ability adequate to safely complete daily activities?: Yes Patient able to express need for assistance with ADLs?: Yes Independently performs ADLs?: Yes (appropriate for developmental age)  Prior Inpatient Therapy Prior Inpatient Therapy: No  Prior Outpatient Therapy Prior Outpatient Therapy: No Does patient have an ACCT team?: No Does patient have Intensive In-House Services?  : No Does patient have [facility] services? : No Does patient have P4CC services?: No  ADL Screening (condition at time of admission) Patient's cognitive ability adequate to safely complete daily activities?: Yes Is the  patient deaf or have difficulty hearing?: No Does the patient have difficulty concentrating, remembering, or making decisions?: No Patient able to express need for assistance with ADLs?: Yes Does the patient have difficulty dressing or bathing?: No Independently performs ADLs?: Yes (appropriate for developmental age) Does the patient have difficulty walking or climbing stairs?: No       Abuse/Neglect Assessment (Assessment to be complete while patient is alone) Abuse/Neglect Assessment Can Be Completed: Yes Physical Abuse: Denies Verbal Abuse: Denies Sexual Abuse: Denies Exploitation of patient/patient's resources: Denies Self-Neglect: Denies     Merchant navy officer (For Healthcare) Does Patient Have a Medical Advance Directive?: No Would patient like information on creating a medical advance directive?: No - Patient declined Nutrition Screen- [facility] Adult/[facility] Patient's home diet: Regular        Disposition:  Disposition Initial Assessment Completed for this Encounter: Yes(Dr. Ignacia Bayley, NP, recommend discharge )  This service was provided via telemedicine using a 2-way, interactive audio and video technology.  Names of all persons participating in this telemedicine service and their role in this encounter. Name: Bobby Sullivan Role:  Patient   Name: Bobby Sullivan Role: TTS assessor    Romen Yutzy [facility] 11/27/2017 11:29 AM

## 2017-11-27 NOTE — Progress Notes (Signed)
11/27/17  1000  Patient's mother states he needs to go to [facility] for treatment. And would like for him to be transferred there.

## 2017-11-27 NOTE — ED Notes (Signed)
Weaned patient off of oxygen. SpO2 is 99% on room air.

## 2017-11-27 NOTE — BHH Suicide Risk Assessment (Signed)
Suicide Risk Assessment  Discharge Assessment   [facility] Discharge Suicide Risk Assessment   Principal Problem: Insomnia Discharge Diagnoses:  Patient Active Problem List   Diagnosis Date Noted  . Inhalant use with inhalant-induced mood disorder [facility]) [F18.94] 11/27/2017    Priority: High  . Insomnia [G47.00] 08/07/2014    Priority: High  . MDD (major depressive disorder) [F32.9] 02/14/2017  . Overdose [T50.901A] 02/13/2017  . Major depressive disorder, single episode, severe (HCC) [F32.2] 02/13/2017  . Generalized anxiety disorder [F41.1] 08/03/2015  . Seizure disorder (HCC) [G40.909]   . Leukocytosis [D72.829] 12/16/2014  . Diphenhydramine overdose [T45.0X1A] 12/16/2014  . Sinus tachycardia [R00.0] 12/16/2014  . Prolonged Q-T interval on ECG [R94.31] 12/16/2014  . Diphenhydramine overdose of undetermined intent [T45.0X4A] 08/06/2014  . Asperger's syndrome [F84.5] 08/06/2014    Total Time spent with patient: 45 minutes  Musculoskeletal: Strength & Muscle Tone: within normal limits Gait & Station: normal Patient leans: N/A  Psychiatric Specialty Exam:   Blood pressure 126/66, pulse 91, temperature 98.5 F (36.9 C), temperature source Oral, resp. rate 18, height 5\' 9"  (1.753 m), weight 72.6 kg, SpO2 96 %.Body mass index is 23.63 kg/m.  General Appearance: Casual  Eye Contact::  Good  Speech:  Normal Rate409  Volume:  Normal  Mood:  Anxious  Affect:  Congruent  Thought Process:  Coherent and Descriptions of Associations: Intact  Orientation:  Full (Time, Place, and Person)  Thought Content:  WDL and Logical  Suicidal Thoughts:  No  Homicidal Thoughts:  No  Memory:  Immediate;   Good Recent;   Good Remote;   Good  Judgement:  Fair  Insight:  Fair  Psychomotor Activity:  Normal  Concentration:  Good  Recall:  Good  Fund of Knowledge:Good  Language: Good  Akathisia:  No  Handed:  Right  AIMS (if indicated):     Assets:  Housing Leisure Time Physical  Health Resilience Social Support Vocational/Educational  Sleep:     Cognition: WNL  ADL's:  Intact   Mental Status Per Nursing Assessment::   On Admission:   27 yo male who presented to the ED after taking 4-5 Unisom to sleep and started feeling funny.  Denies he was trying to kill himself, denies huffing but agreeable to meet with Peer Support.  No suicidal/homicidal ideations, hallucinations, or withdrawal symptoms.  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  Discussed medications and decided on prescribing Seroquel for sleep PRN.  Demographic Factors:  Male and Caucasian  Loss Factors: NA  Historical Factors: NA  Risk Reduction Factors:   Sense of responsibility to family, Living with another person, especially a relative and Positive social support  Continued Clinical Symptoms:  Anxiety, mild  Cognitive Features That Contribute To Risk:  None    Suicide Risk:  Minimal: No identifiable suicidal ideation.  Patients presenting with no risk factors but with morbid ruminations; may be classified as minimal risk based on the severity of the depressive symptoms    Plan Of Care/Follow-up recommendations:  Inhalant abuse with inhalant induced mood disorder: -Started Seroquel 25 mg at bedtime as needed for sleep  Nanine Means, NP 11/27/2017, 1:37 PM

## 2019-04-01 ENCOUNTER — Ambulatory Visit: Payer: Self-pay | Attending: Internal Medicine

## 2019-04-01 DIAGNOSIS — Z23 Encounter for immunization: Secondary | ICD-10-CM | POA: Insufficient documentation

## 2019-04-01 NOTE — Progress Notes (Signed)
   Covid-19 Vaccination Clinic  Name:  Lional Icenogle    MRN: [id number] DOB: 22-Feb-1990  04/01/2019  Mr. Klich was observed post Covid-19 immunization for 15 minutes without incident. He was provided with Vaccine Information Sheet and instruction to access the V-Safe system.   Mr. Hannan was instructed to call 911 with any severe reactions post vaccine: Marland Kitchen Difficulty breathing  . Swelling of face and throat  . A fast heartbeat  . A bad rash all over body  . Dizziness and weakness   Immunizations Administered    Name Date Dose VIS Date Route   Pfizer COVID-19 Vaccine 04/01/2019 12:09 AM 0.3 mL 01/05/2019 Intramuscular   Manufacturer: [employer], [employer]   Lot: [id number]   NDC: [address]-3

## 2019-05-02 ENCOUNTER — Ambulatory Visit: Payer: Self-pay | Attending: Internal Medicine

## 2019-05-02 DIAGNOSIS — Z23 Encounter for immunization: Secondary | ICD-10-CM

## 2019-05-02 NOTE — Progress Notes (Signed)
   Covid-19 Vaccination Clinic  Name:  Lewi Drost    MRN: [id number] DOB: 1990-04-12  05/02/2019  Mr. Shifflett was observed post Covid-19 immunization for 15 minutes without incident. He was provided with Vaccine Information Sheet and instruction to access the V-Safe system.   Mr. Luu was instructed to call 911 with any severe reactions post vaccine: Marland Kitchen Difficulty breathing  . Swelling of face and throat  . A fast heartbeat  . A bad rash all over body  . Dizziness and weakness   Immunizations Administered    Name Date Dose VIS Date Route   Pfizer COVID-19 Vaccine 05/02/2019  3:18 PM 0.3 mL 01/05/2019 Intramuscular   Manufacturer: [employer], [employer]   Lot: [id number]   NDC: [address]-0

## 2019-06-28 IMAGING — CT CT HEAD W/O CM
3 series · 15 of 47 positions shown, 18 images · non-contrast
Comparison: Head CT dated 12/16/2014

CLINICAL DATA: 24-year-old male with altered mental status.

EXAM:
CT HEAD WITHOUT CONTRAST
TECHNIQUE: Contiguous axial images were obtained from the base of the skull
through the vertex without intravenous contrast.

[Series 2: head wo · axial · 0.50mm/px · z∈[-157,-22]mm · 9 of 33 slices shown, 12 images]
[im 3/33  brain]
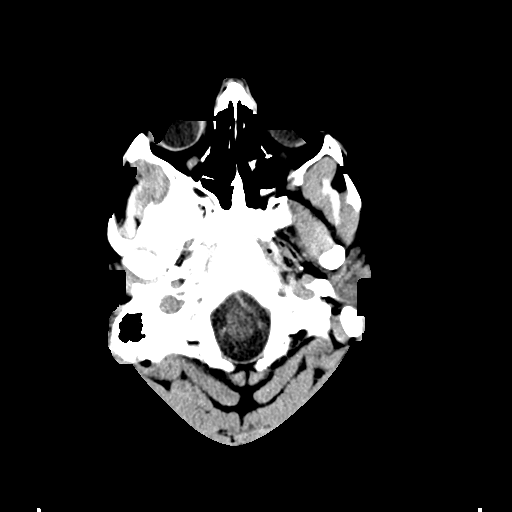
[im 3/33  bone]
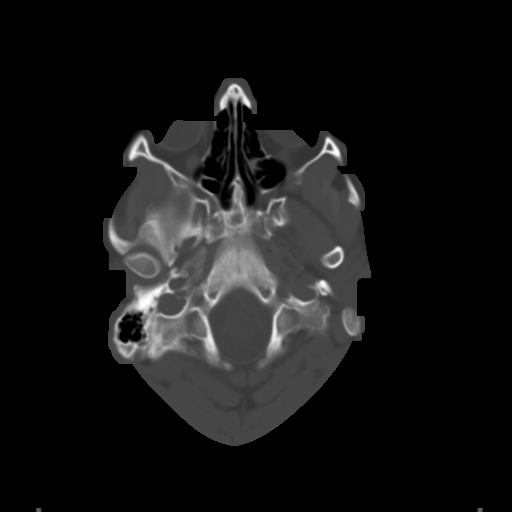
[im 6/33  brain]
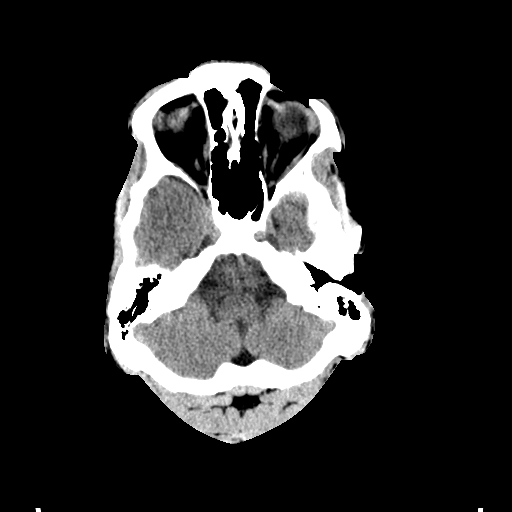
[im 9/33  brain]
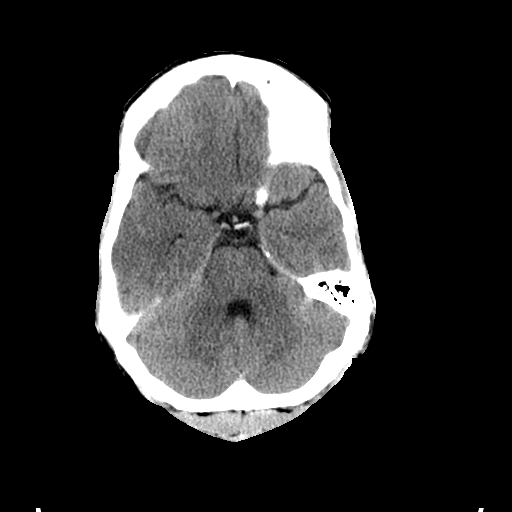
[im 13/33  brain]
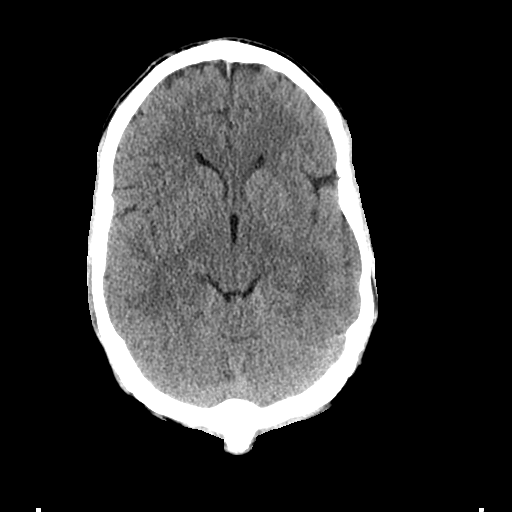
[im 17/33  brain]
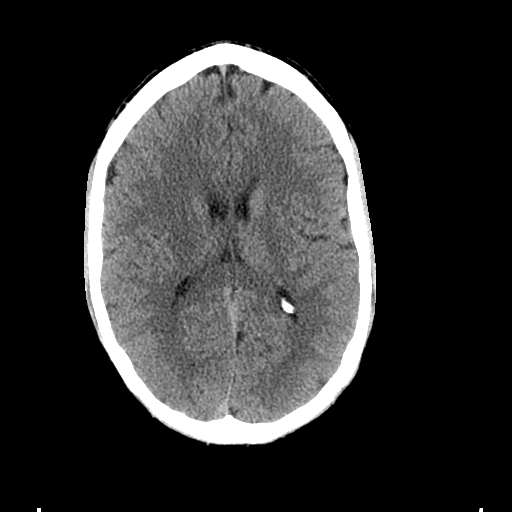
[im 17/33  bone]
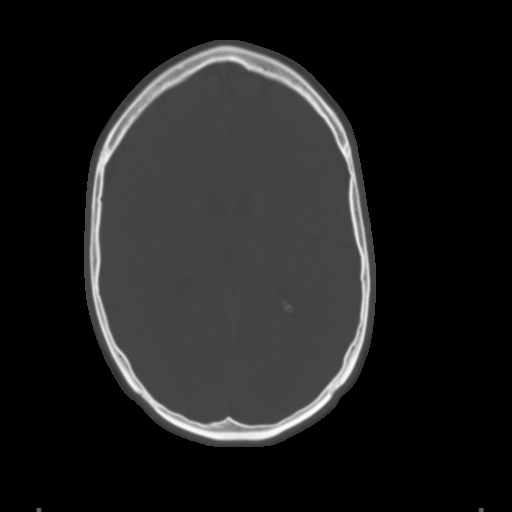
[im 20/33  brain]
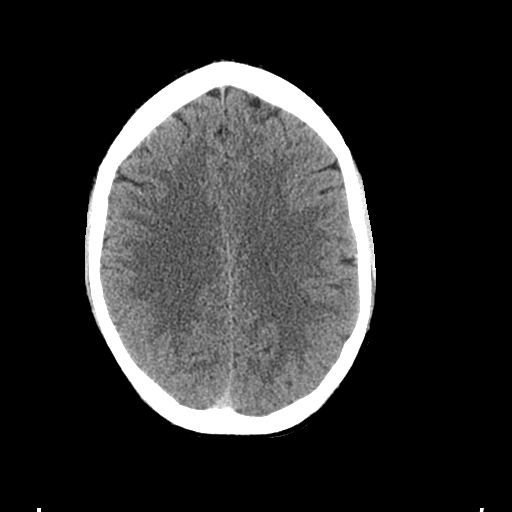
[im 24/33  brain]
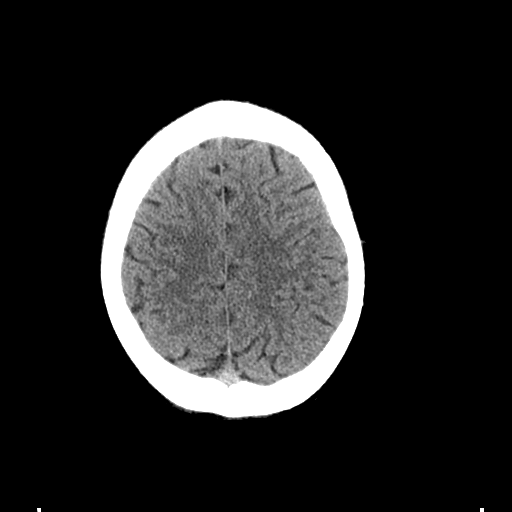
[im 27/33  brain]
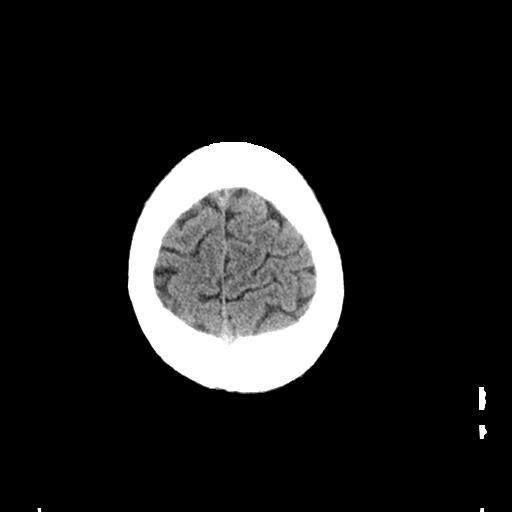
[im 30/33  brain]
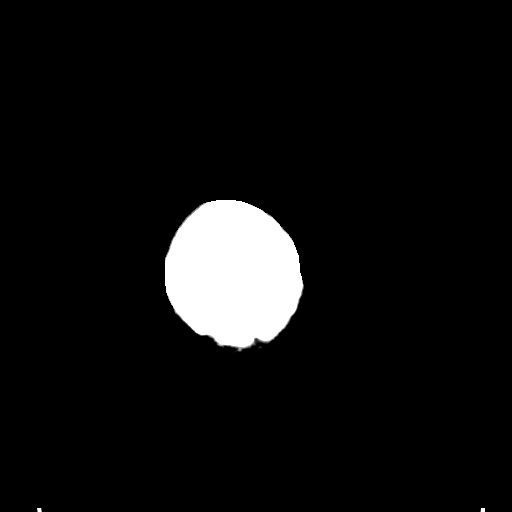
[im 30/33  bone]
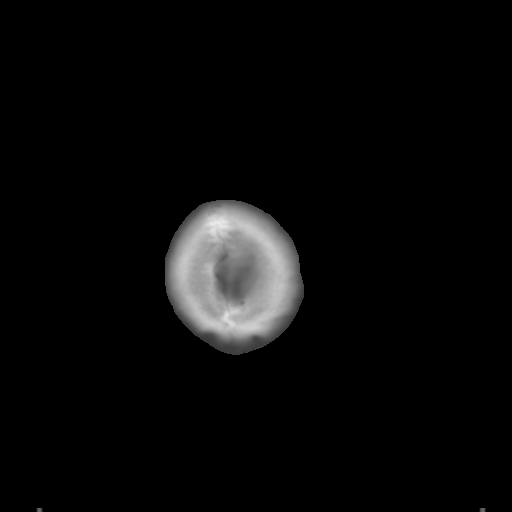

[Series 4: coronal soft tissue · coronal · 0.33mm/px · 3 of 75 slices shown]
[im 25/75  brain]
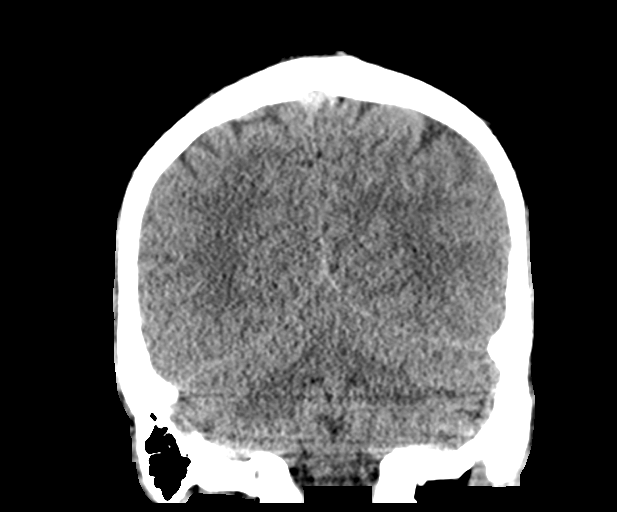
[im 33/75  brain]
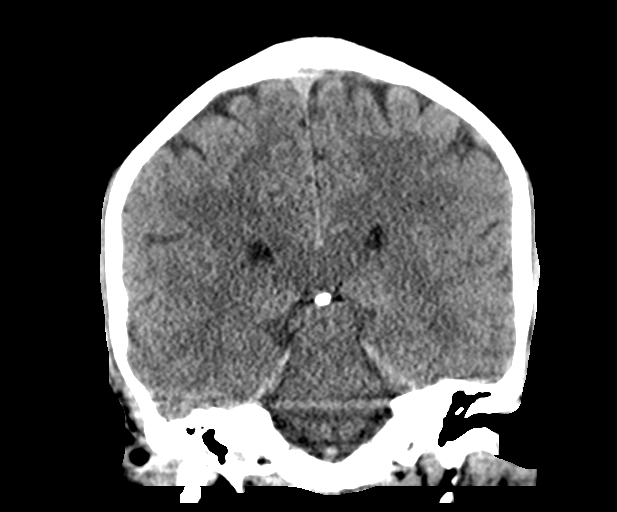
[im 42/75  brain]
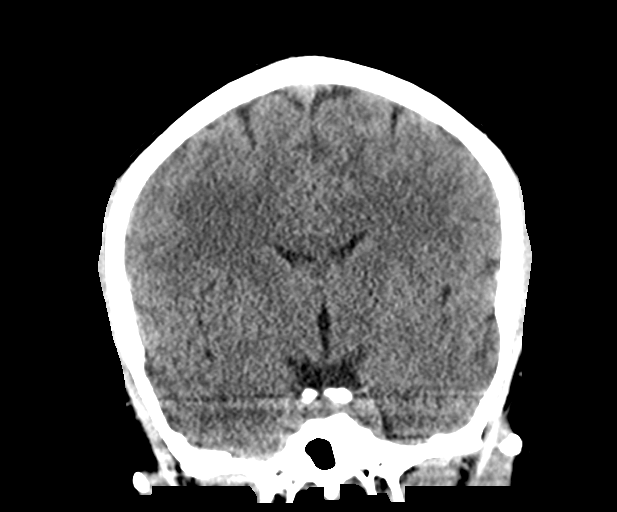

[Series 5: sagittal soft tissue · sagittal · 0.35mm/px · 3 of 58 slices shown]
[im 20/58  brain]
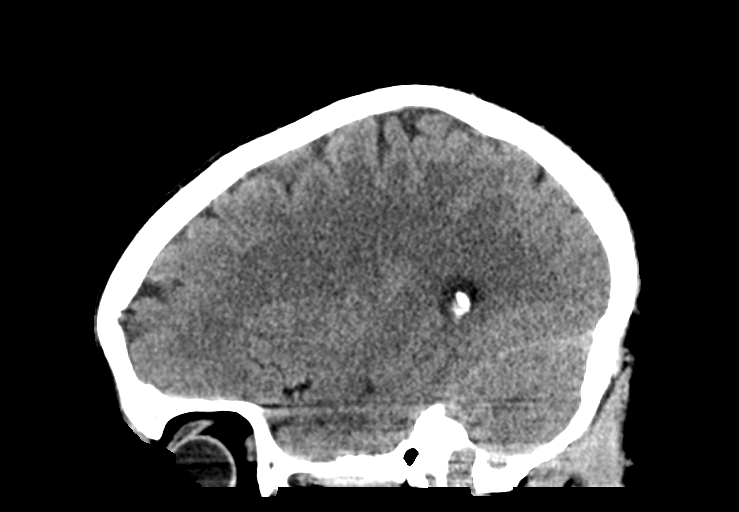
[im 29/58  brain]
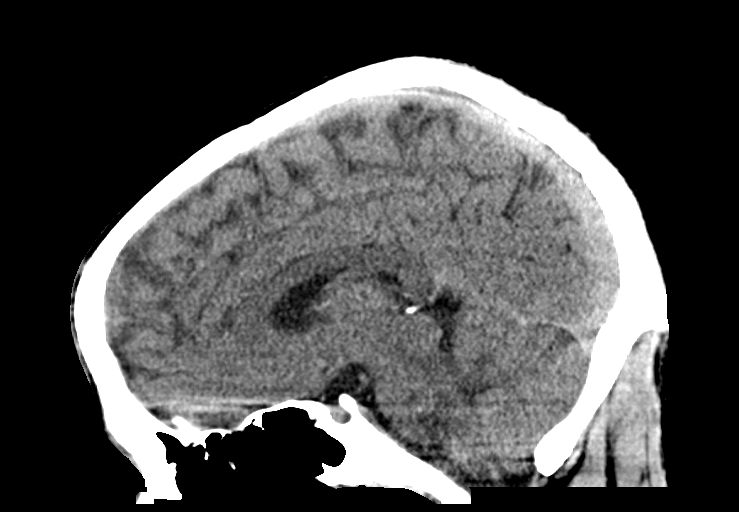
[im 39/58  brain]
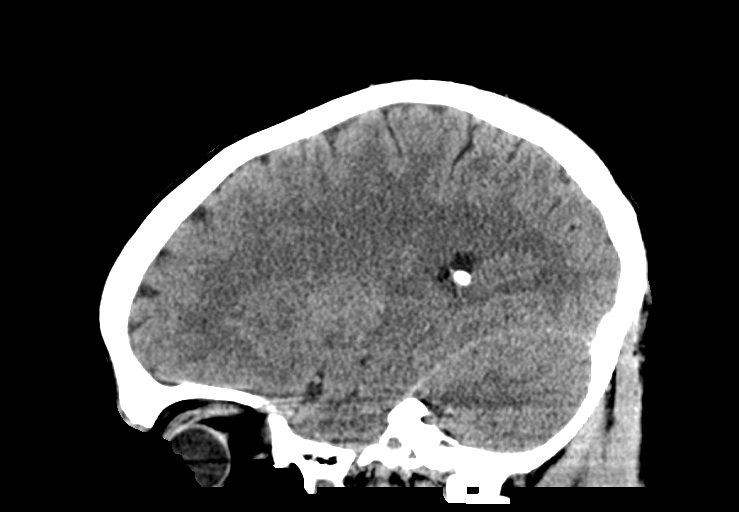

[15 of 47 positions shown; findings below may reference images not displayed]

FINDINGS: Brain: No evidence of acute infarction, hemorrhage, hydrocephalus,
extra-axial collection or mass lesion/mass effect.

Vascular: No hyperdense vessel or unexpected calcification.

Skull: Normal. Negative for fracture or focal lesion.

Sinuses/Orbits: No acute finding.

Other: None
IMPRESSION: Normal unenhanced CT of the brain.

## 2022-10-18 ENCOUNTER — Encounter (HOSPITAL_COMMUNITY): Payer: Self-pay | Admitting: Registered Nurse

## 2022-10-18 ENCOUNTER — Ambulatory Visit (HOSPITAL_COMMUNITY)
Admission: EM | Admit: 2022-10-18 | Discharge: 2022-10-19 | Disposition: A | Discharge status: Other Acute Inpatient Hospital | Payer: No Payment, Other | Attending: Registered Nurse | Admitting: Registered Nurse

## 2022-10-18 ENCOUNTER — Other Ambulatory Visit: Payer: Self-pay

## 2022-10-18 DIAGNOSIS — F845 Asperger's syndrome: Secondary | ICD-10-CM | POA: Diagnosis not present

## 2022-10-18 DIAGNOSIS — Z9189 Other specified personal risk factors, not elsewhere classified: Secondary | ICD-10-CM | POA: Insufficient documentation

## 2022-10-18 DIAGNOSIS — F4323 Adjustment disorder with mixed anxiety and depressed mood: Secondary | ICD-10-CM | POA: Diagnosis present

## 2022-10-18 DIAGNOSIS — F309 Manic episode, unspecified: Secondary | ICD-10-CM | POA: Insufficient documentation

## 2022-10-18 DIAGNOSIS — F3010 Manic episode without psychotic symptoms, unspecified: Secondary | ICD-10-CM | POA: Diagnosis present

## 2022-10-18 DIAGNOSIS — G4700 Insomnia, unspecified: Secondary | ICD-10-CM | POA: Diagnosis not present

## 2022-10-18 DIAGNOSIS — F411 Generalized anxiety disorder: Secondary | ICD-10-CM | POA: Diagnosis present

## 2022-10-18 LAB — CBC WITH DIFFERENTIAL/PLATELET
Abs Immature Granulocytes: 0.02 10*3/uL (ref 0.00–0.07)
Basophils Absolute: 0.1 10*3/uL (ref 0.0–0.1)
Basophils Relative: 2 %
Eosinophils Absolute: 0.3 10*3/uL (ref 0.0–0.5)
Eosinophils Relative: 3 %
HCT: 47.7 % (ref 39.0–52.0)
Hemoglobin: 16.7 g/dL (ref 13.0–17.0)
Immature Granulocytes: 0 %
Lymphocytes Relative: 40 %
Lymphs Abs: 3.3 10*3/uL (ref 0.7–4.0)
MCH: 31.3 pg (ref 26.0–34.0)
MCHC: 35 g/dL (ref 30.0–36.0)
MCV: 89.5 fL (ref 80.0–100.0)
Monocytes Absolute: 0.8 10*3/uL (ref 0.1–1.0)
Monocytes Relative: 10 %
Neutro Abs: 3.7 10*3/uL (ref 1.7–7.7)
Neutrophils Relative %: 45 %
Platelets: 339 10*3/uL (ref 150–400)
RBC: 5.33 MIL/uL (ref 4.22–5.81)
RDW: 11.9 % (ref 11.5–15.5)
WBC: 8.3 10*3/uL (ref 4.0–10.5)
nRBC: 0 % (ref 0.0–0.2)

## 2022-10-18 LAB — POCT URINE DRUG SCREEN - MANUAL ENTRY (I-SCREEN)
POC Amphetamine UR: NOT DETECTED
POC Buprenorphine (BUP): NOT DETECTED
POC Cocaine UR: NOT DETECTED
POC Marijuana UR: POSITIVE — AB
POC Methadone UR: NOT DETECTED
POC Methamphetamine UR: NOT DETECTED
POC Morphine: NOT DETECTED
POC Oxazepam (BZO): NOT DETECTED
POC Oxycodone UR: NOT DETECTED
POC Secobarbital (BAR): NOT DETECTED

## 2022-10-18 LAB — URINALYSIS, ROUTINE W REFLEX MICROSCOPIC
Bilirubin Urine: NEGATIVE
Glucose, UA: NEGATIVE mg/dL
Hgb urine dipstick: NEGATIVE
Ketones, ur: 5 mg/dL — AB
Leukocytes,Ua: NEGATIVE
Nitrite: NEGATIVE
Protein, ur: NEGATIVE mg/dL
Specific Gravity, Urine: 1.027 (ref 1.005–1.030)
pH: 5 (ref 5.0–8.0)

## 2022-10-18 LAB — COMPREHENSIVE METABOLIC PANEL
ALT: 17 U/L (ref 0–44)
AST: 22 U/L (ref 15–41)
Albumin: 4.8 g/dL (ref 3.5–5.0)
Alkaline Phosphatase: 39 U/L (ref 38–126)
Anion gap: 9 (ref 5–15)
BUN: 9 mg/dL (ref 6–20)
CO2: 28 mmol/L (ref 22–32)
Calcium: 9.3 mg/dL (ref 8.9–10.3)
Chloride: 100 mmol/L (ref 98–111)
Creatinine, Ser: 1.2 mg/dL (ref 0.61–1.24)
GFR, Estimated: >60 mL/min (ref >60)
Glucose, Bld: 80 mg/dL (ref 70–99)
Potassium: 4.1 mmol/L (ref 3.5–5.1)
Sodium: 137 mmol/L (ref 135–145)
Total Bilirubin: 0.7 mg/dL (ref 0.3–1.2)
Total Protein: 7.3 g/dL (ref 6.5–8.1)

## 2022-10-18 LAB — LIPID PANEL
Cholesterol: 264 mg/dL — ABNORMAL HIGH (ref 0–200)
HDL: 40 mg/dL — ABNORMAL LOW (ref >40)
LDL Cholesterol: 174 mg/dL — ABNORMAL HIGH (ref 0–99)
Total CHOL/HDL Ratio: 6.6 RATIO
Triglycerides: 251 mg/dL — ABNORMAL HIGH (ref <150)
VLDL: 50 mg/dL — ABNORMAL HIGH (ref 0–40)

## 2022-10-18 LAB — TSH: TSH: 1.478 u[IU]/mL (ref 0.350–4.500)

## 2022-10-18 LAB — ETHANOL: Alcohol, Ethyl (B): <10 mg/dL (ref <10)

## 2022-10-18 LAB — HEMOGLOBIN A1C
Hgb A1c MFr Bld: 5.1 % (ref 4.8–5.6)
Mean Plasma Glucose: 99.67 mg/dL

## 2022-10-18 LAB — MAGNESIUM: Magnesium: 2.1 mg/dL (ref 1.7–2.4)

## 2022-10-18 MED ORDER — MAGNESIUM HYDROXIDE 400 MG/5ML PO SUSP: 30.0000 mL | Freq: Every day | ORAL | Status: DC | PRN

## 2022-10-18 MED ORDER — ALUM & MAG HYDROXIDE-SIMETH 200-200-20 MG/5ML PO SUSP: 30.0000 mL | ORAL | Status: DC | PRN

## 2022-10-18 MED ORDER — QUETIAPINE FUMARATE 50 MG PO TABS
50.0000 mg | ORAL_TABLET | Freq: Every day | ORAL | Status: DC
  Administered 2022-10-18: 50 mg via ORAL
  Filled 2022-10-18: qty 1

## 2022-10-18 MED ORDER — ACETAMINOPHEN 325 MG PO TABS
650.0000 mg | ORAL_TABLET | Freq: Four times a day (QID) | ORAL | Status: DC | PRN
  Administered 2022-10-19: 650 mg via ORAL
  Filled 2022-10-18: qty 2

## 2022-10-18 NOTE — ED Notes (Signed)
Patient observed/assessed in bed/chair resting quietly appearing in no distress and verbalizing no complaints at this time. Will continue to monitor.
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
## 2022-10-19 ENCOUNTER — Emergency Department (HOSPITAL_COMMUNITY): Payer: Self-pay

## 2022-10-19 ENCOUNTER — Other Ambulatory Visit: Payer: Self-pay

## 2022-10-19 ENCOUNTER — Encounter (HOSPITAL_COMMUNITY): Payer: Self-pay | Admitting: Registered Nurse

## 2022-10-19 ENCOUNTER — Emergency Department (HOSPITAL_COMMUNITY)
Admission: EM | Admit: 2022-10-19 | Discharge: 2022-10-19 | Disposition: A | Discharge status: Home / Self Care | Payer: Self-pay | Attending: Emergency Medicine | Admitting: Emergency Medicine

## 2022-10-19 ENCOUNTER — Encounter (HOSPITAL_COMMUNITY): Payer: Self-pay | Admitting: Emergency Medicine

## 2022-10-19 DIAGNOSIS — R569 Unspecified convulsions: Secondary | ICD-10-CM | POA: Insufficient documentation

## 2022-10-19 DIAGNOSIS — R519 Headache, unspecified: Secondary | ICD-10-CM | POA: Insufficient documentation

## 2022-10-19 LAB — CBG MONITORING, ED: Glucose-Capillary: 73 mg/dL (ref 70–99)

## 2022-10-19 LAB — COMPREHENSIVE METABOLIC PANEL
ALT: 15 U/L (ref 0–44)
AST: 18 U/L (ref 15–41)
Albumin: 4.1 g/dL (ref 3.5–5.0)
Alkaline Phosphatase: 36 U/L — ABNORMAL LOW (ref 38–126)
Anion gap: 11 (ref 5–15)
BUN: 11 mg/dL (ref 6–20)
CO2: 26 mmol/L (ref 22–32)
Calcium: 9.1 mg/dL (ref 8.9–10.3)
Chloride: 101 mmol/L (ref 98–111)
Creatinine, Ser: 1.07 mg/dL (ref 0.61–1.24)
GFR, Estimated: >60 mL/min (ref >60)
Glucose, Bld: 80 mg/dL (ref 70–99)
Potassium: 3.9 mmol/L (ref 3.5–5.1)
Sodium: 138 mmol/L (ref 135–145)
Total Bilirubin: 0.7 mg/dL (ref 0.3–1.2)
Total Protein: 6.6 g/dL (ref 6.5–8.1)

## 2022-10-19 LAB — ETHANOL: Alcohol, Ethyl (B): <10 mg/dL (ref <10)

## 2022-10-19 LAB — CBC WITH DIFFERENTIAL/PLATELET
Abs Immature Granulocytes: 0.01 10*3/uL (ref 0.00–0.07)
Basophils Absolute: 0.1 10*3/uL (ref 0.0–0.1)
Basophils Relative: 1 %
Eosinophils Absolute: 0.1 10*3/uL (ref 0.0–0.5)
Eosinophils Relative: 2 %
HCT: 45.1 % (ref 39.0–52.0)
Hemoglobin: 15.4 g/dL (ref 13.0–17.0)
Immature Granulocytes: 0 %
Lymphocytes Relative: 28 %
Lymphs Abs: 2 10*3/uL (ref 0.7–4.0)
MCH: 30.4 pg (ref 26.0–34.0)
MCHC: 34.1 g/dL (ref 30.0–36.0)
MCV: 89.1 fL (ref 80.0–100.0)
Monocytes Absolute: 0.6 10*3/uL (ref 0.1–1.0)
Monocytes Relative: 9 %
Neutro Abs: 4.3 10*3/uL (ref 1.7–7.7)
Neutrophils Relative %: 60 %
Platelets: 278 10*3/uL (ref 150–400)
RBC: 5.06 MIL/uL (ref 4.22–5.81)
RDW: 12 % (ref 11.5–15.5)
WBC: 7.1 10*3/uL (ref 4.0–10.5)
nRBC: 0 % (ref 0.0–0.2)

## 2022-10-19 LAB — MAGNESIUM: Magnesium: 2 mg/dL (ref 1.7–2.4)

## 2022-10-19 MED ORDER — LORAZEPAM 1 MG PO TABS
1.0000 mg | ORAL_TABLET | Freq: Once | ORAL | Status: AC | PRN
  Administered 2022-10-19: 1 mg via ORAL
  Filled 2022-10-19: qty 1

## 2022-10-19 MED ORDER — SODIUM CHLORIDE 0.9 % IV BOLUS
1000.0000 mL | Freq: Once | INTRAVENOUS | Status: AC
  Administered 2022-10-19: 1000 mL via INTRAVENOUS

## 2022-10-19 MED ORDER — PROCHLORPERAZINE EDISYLATE 10 MG/2ML IJ SOLN
10.0000 mg | Freq: Once | INTRAMUSCULAR | Status: AC
  Administered 2022-10-19: 10 mg via INTRAVENOUS
  Filled 2022-10-19: qty 2

## 2022-10-19 NOTE — ED Triage Notes (Addendum)
Pt BIB [facility] from [facility] for seizure activity.  Pt spent the night at [facility] for insomnia, PTSD, HA and night terrors.  Pt received Seroquel during this visit.  Pt was going to be psychiatrically DC when he had 2 witnessed tonic-clonic seizures lasting 1-2 min. Pt was not given medication for this. No injuries noted. . Pt was diaphoretic during the episode and does not remember it. Pt complains of HA   Pt states he  has no seizure activity or other psych dx but both are listed in chart  118/62 93% HR 104 RR 20 94

## 2022-10-19 NOTE — ED Notes (Signed)
Patient discharged to [facility], report called to [facility] RN, patient had 2 seizures at this facility, note to follow, VSS at discharge. Patient did require 4L of oxygen for about 10 minutes due to low oxygen saturations.

## 2022-10-19 NOTE — ED Notes (Signed)
Patient observed/assessed in bed/chair resting quietly appearing in no distress and verbalizing no complaints at this time. Will continue to monitor.  

## 2022-10-19 NOTE — ED Notes (Signed)
Pt requesting that IV be removed, anxious appearing stating that he does not want to continue care. This RN talked with pt and pt is currently willing to stay for CT scan. Pt transported to CT at this time. IV removed. MD Trifan made aware. Orders placed for PRN Ativan.

## 2022-10-19 NOTE — Discharge Instructions (Signed)
If you are unable to make it to your scheduled appointments please call to reschedule or you may go during walk in hours listed below   [facility]: Outpatient psychiatric Services:   Please see the walk in hours listed below.  Medication Management New Patient needing Medication Management Walk-in, and Existing Patients needing to see a provider for management coming as a walk in   Monday thru Friday 8:00 AM first come first serve until slots are full.  Recommend being there by 7:15 AM to ensure a slot is open.  Therapy New Patient Therapy Intake and Existing Patients needing to see therapist coming in as a walk in.   Monday, Wednesday, and Thursday morning at 8:00 am first come first serve.  Recommend being there by 7:15 AM to ensure a slot is open.    Every 1st, 2nd, and 3rd Friday at 1:00 PM first come first serve until slots are full.  Will still need to come in that morning at 7:15 AM to get registered for an afternoon slot.  For all walk-ins we ask that you arrive by 7:15 am because patients will be seen in there order of arrival (FIRST COME FIRST SERVE) Availability is limited, therefore you may not be seen on the same day that you walk in if all slots are full.    Our goal is to serve and meet the needs of our community to the best of our ability.     [facility] Phone: [phone] Physical Address:  [address], [address] [place], [place]  [address]  Outpatient Services Life can be a challenge for [place] all. [facility]'s outpatient services offer a caring and experienced team of professionals who help people take the first step, which is often the most difficult. Together, we develop a well-defined and customized plan for each person that meets the individual's needs and goals. Each plan includes evidence-based practices as proven strategies that work. From board-certified psychiatrists, registered  nurses, therapists, and outpatient office administrative professionals--all care and want to help you and your loved ones in every way possible to ensure you succeed.  Open Access:   One way we ensure we get people the help they need when they request is is through Open Access. This service encourages individuals who are in dire need of our services and are new to [facility] to simply walk in or call us for virtual options, Monday through Friday between 8 a.m. and 3 p.m. On the same day of contact, if the individual has time to do so, he/she/they will complete patient registration and a comprehensive clinical assessment with a therapist. The assessment will provide treatment recommendations and the individual will leave with an appointment for the next service or a referral to the proper level of care.  While this process takes a few hours and is longer than a traditional appointment, it reduces what could otherwise be months of waiting for help or an appointment.   Telehealth Services:  [facility]'s telehealth services provide a safe, secure, and easy way to connect with a therapist or mental health provider for an individual or group therapy appointment. Click here to learn more about how [facility]'s telehealth services provide an important treatment option. These services may be accessed from the comfort of an individual's home, or at one of [facility]'s behavioral health offices such as this one where an individual may use on-site equipment for the visit.   Telehealth Services   A SAFE,  SECURE, CONVENIENT TREATMENT OPTION:  [facility] telehealth services provide you with a safe, secure, and easy way to connect with your therapist or mental health provider for an individual or group therapy appointment.  Using Psychologist, prison and probation services, telehealth appointments allow you to meet with [employer], therapists, nurse practitioners, and psychiatrists from your desktop or laptop computer, cell phone, or tablet  device. Telehealth visits are compliant with all Health Insurance Portability and Accountability Act (HIPAA) requirements and you can complete a telehealth visit from just about anywhere using internet or wi-fi access.  HOW DOES IT WORK?  [facility] uses the [employer] platform to host telehealth appointments. Prior to your scheduled visit, you will receive a direct link via text or email which will take you to your provider's online waiting room. Simply click that link at your appointment time and your provider will be notified that you've arrived. He or she will meet you online and you will complete your visit. Your provider may also have resources and information posted in his or her virtual waiting room which you may find helpful throughout your treatment.  In addition, you may receive a reminder telephone call from a [facility] team member in the days leading up to your appointment. During that call, you will have an opportunity to provide important health information and medication updates which may save time during your scheduled appointment.     WHO USES TELEHEALTH SERVICES?  Telehealth services provide an alternative to in-person, face-to-face treatment for individuals receiving outpatient behavioral health services. At [facility], telehealth visits may also be used by individuals receiving Assertive Community Treatment (ACT) Team and Individual Placement and Support (IPS) services and other community-based, specialized services as needed. Telehealth services are also used for group therapy sessions, allowing people we support to connect during treatment with others who have similar experiences.

## 2022-10-19 NOTE — Discharge Instructions (Signed)
You were seen to have a possible seizure earlier today.  It is not clear if this was a true seizure or another medical episode.  You will need follow-up with neurologist for this issue.  Please call the number above to schedule follow-up appointment in the office with a neurologist.  In the meantime you should not drive a car for the next 6 months, or until you are cleared to do so by a neurologist.  Driving can be extremely dangerous to you or to other people if you have a seizure behind the wheel of the car.  Your urine drug screen showed that you have marijuana or THC in your system.  Street drugs including marijuana can be a trigger for seizures.  Please avoid using these drugs in the future.  If you have another seizure episode please call 911 and return to the ER.  Patients who are having multiple seizure episodes may require hospitalization and seizure medications.

## 2022-10-19 NOTE — ED Provider Notes (Signed)
FBC/OBS ASAP Discharge Summary  Date and Time: 10/19/2022 11:22 AM  Name: Bobby Sullivan  MRN:  [id number]   Discharge Diagnoses:  Final diagnoses:  Adjustment disorder with mixed anxiety and depressed mood  Asperger's syndrome  Manic behavior [facility])   Stay Summary: Bobby Sullivan 32 y.o. male patient presented to [facility] voluntarily via [facility] with complaints of insomnia and worsening anxiety.  Police reports patient was picked up at park after calling in that he want to be taken to [facility].  Patient admitted to [facility] continuous observation unit for safety and stabilization to determine appropriate disposition  [name] reassessed face to face by this provider, consulted with Dr. Nelly Rout; and chart reviewed on 10/19/22.  On evaluation Bobby Sullivan reports he is feeling much better since he slept last night.  Patient states he has to go to work today and his mother will be able to pick him up.  Patient denies suicidal/self-harm/homicidal ideation, psychosis, and paranoia.  He states that he is interested in outpatient psychiatric services for medication management and therapy.  Patient continues to have the abnormal movements of hands, and lower extremities but states that is chronic.   During evaluation Bobby Sullivan is sitting up in bed with no noted distress.  He is alert/oriented x 4, calm, cooperative, and attentive.  His responses were appropriated to assessment questions.  His mood is euthymic with congruent affect.  He spoke in a clear tone at moderate volume, and normal pace, with good eye contact.   He denies suicidal/self-harm/homicidal ideation, psychosis, and paranoia.  Objectively:  there is no evidence of psychosis/mania or delusional thinking.  He conversed coherently, with goal directed thoughts, and no distractibility, or pre-occupation.  Patient referred to outpatient psychiatric services for medication management and counseling  Total Time spent with patient: 45  minutes  Past Psychiatric History: Insomnia, Major depressive disorder, general anxiety disorder, multiple diphenhydramine (Benadryl) overdose (undetermined intent), and misuse.   Past Medical History:  Past Medical History:  Diagnosis Date   Anxiety    Aspergers' syndrome    Insomnia     Family History: Patient unaware Family Psychiatric History: Unaware Social History:  Social History   Tobacco Use   Smoking status: Every Day    Current packs/day: 0.10    Types: Cigarettes   Smokeless tobacco: Never  Substance Use Topics   Alcohol use: Yes    Comment: occasional   Drug use: Yes    Comment: Per triple C, benadryl, duster     Tobacco Cessation:  A prescription for an FDA-approved tobacco cessation medication was offered at discharge and the patient refused  Current Medications:  Current Facility-Administered Medications  Medication Dose Route Frequency Provider Last Rate Last Admin   acetaminophen (TYLENOL) tablet 650 mg  650 mg Oral Q6H PRN Breann Losano B, NP   650 mg at 10/19/22 0747   alum & mag hydroxide-simeth (MAALOX/MYLANTA) 200-200-20 MG/5ML suspension 30 mL  30 mL Oral Q4H PRN Kristian Mogg B, NP       magnesium hydroxide (MILK OF MAGNESIA) suspension 30 mL  30 mL Oral Daily PRN Miner Koral B, NP       QUEtiapine (SEROQUEL) tablet 50 mg  50 mg Oral QHS Day Greb B, NP   50 mg at 10/18/22 2133   Current Outpatient Medications  Medication Sig Dispense Refill   aspirin 325 MG tablet Take 650 mg by mouth every 6 (six) hours as needed for headache.     ibuprofen (  ADVIL) 200 MG tablet Take 400 mg by mouth every 6 (six) hours as needed for headache.      PTA Medications:  Facility Ordered Medications  Medication   acetaminophen (TYLENOL) tablet 650 mg   alum & mag hydroxide-simeth (MAALOX/MYLANTA) 200-200-20 MG/5ML suspension 30 mL   magnesium hydroxide (MILK OF MAGNESIA) suspension 30 mL   QUEtiapine (SEROQUEL) tablet 50 mg   PTA Medications   Medication Sig   aspirin 325 MG tablet Take 650 mg by mouth every 6 (six) hours as needed for headache.   ibuprofen (ADVIL) 200 MG tablet Take 400 mg by mouth every 6 (six) hours as needed for headache.        No data to display          Flowsheet Row ED from 10/18/2022 in [facility]  C-SSRS RISK CATEGORY No Risk       Musculoskeletal  Strength & Muscle Tone: within normal limits Gait & Station: normal Patient leans: N/A  Psychiatric Specialty Exam  Presentation  General Appearance:  Appropriate for Environment; Disheveled  Eye Contact: Good  Speech: Clear and Coherent; Normal Rate  Speech Volume: Normal  Handedness: Right   Mood and Affect  Mood: Anxious  Affect: Congruent   Thought Process  Thought Processes: Goal Directed; Coherent  Descriptions of Associations:Intact  Orientation:Full (Time, Place and Person)  Thought Content:Logical  Diagnosis of Schizophrenia or Schizoaffective disorder in past: No    Hallucinations:Hallucinations: None  Ideas of Reference:None  Suicidal Thoughts:Suicidal Thoughts: No  Homicidal Thoughts:Homicidal Thoughts: No   Sensorium  Memory: Immediate Good; Recent Good  Judgment: Intact  Insight: Present   Executive Functions  Concentration: Good  Attention Span: Good  Recall: Good  Fund of Knowledge: Good  Language: Good   Psychomotor Activity  Psychomotor Activity: Psychomotor Activity: Psychomotor Retardation   Assets  Assets: Communication Skills; Desire for Improvement; Housing; Social Support   Sleep  Sleep: Sleep: Good (reports he slept well last night with medicaton) Number of Hours of Sleep: 3   Nutritional Assessment (For OBS and FBC admissions only) Has the patient had a weight loss or gain of 10 pounds or more in the last 3 months?: No Has the patient had a decrease in food intake/or appetite?: No Does the patient have dental  problems?: No Does the patient have eating habits or behaviors that may be indicators of an eating disorder including binging or inducing vomiting?: No Has the patient recently lost weight without trying?: 0 Has the patient been eating poorly because of a decreased appetite?: 0 Malnutrition Screening Tool Score: 0    Physical Exam  Physical Exam Vitals and nursing note reviewed.  Constitutional:      General: He is not in acute distress.    Appearance: Normal appearance. He is not ill-appearing.  HENT:     Head: Normocephalic.  Eyes:     Pupils: Pupils are equal, round, and reactive to light.  Cardiovascular:     Rate and Rhythm: Normal rate.  Pulmonary:     Effort: Pulmonary effort is normal. No respiratory distress.  Musculoskeletal:        General: Normal range of motion.     Cervical back: Normal range of motion.  Skin:    General: Skin is warm and dry.  Neurological:     Mental Status: He is alert and oriented to person, place, and time.  Psychiatric:        Attention and Perception: Attention and perception normal.  He does not perceive auditory or visual hallucinations.        Mood and Affect: Affect normal. Mood is anxious.        Speech: Speech normal.        Behavior: Behavior normal. Behavior is cooperative.        Thought Content: Thought content normal. Thought content is not paranoid or delusional. Thought content does not include homicidal or suicidal ideation.        Cognition and Memory: Cognition normal.        Judgment: Judgment is impulsive.    Review of Systems  Constitutional:        No other complaints voiced  Psychiatric/Behavioral:  Depression: Stable. Hallucinations: Denies. Suicidal ideas: Denies. Nervous/anxious: Stable. Insomnia: Better with medication.   All other systems reviewed and are negative.  Blood pressure 111/65, pulse 86, temperature 98.2 F (36.8 C), temperature source Oral, resp. rate 18, SpO2 98%. There is no height or weight on  file to calculate BMI.  Demographic Factors:  Male and Caucasian  Loss Factors: NA  Historical Factors: Impulsivity  Risk Reduction Factors:   Sense of responsibility to family, Employed, Living with another person, especially a relative, and Positive social support  Continued Clinical Symptoms:  Previous Psychiatric Diagnoses and Treatments  Cognitive Features That Contribute To Risk:  None    Suicide Risk:  Minimal: No identifiable suicidal ideation.  Patients presenting with no risk factors but with morbid ruminations; may be classified as minimal risk based on the severity of the depressive symptoms  Plan Of Care/Follow-up recommendations:  Other:  Follow up with resources given  Disposition: No evidence of imminent risk to self or others at present.   Patient does not meet criteria for psychiatric inpatient admission. Supportive therapy provided about ongoing stressors. Discussed crisis plan, support from social network, calling 911, coming to the Emergency Department, and calling Suicide Hotline.  Prior to discharge patient had a seizure.  Patient wanting to be sent to the emergency roolm.  Kyndell Zeiser, NP 10/19/2022, 11:22 AM

## 2022-10-19 NOTE — ED Notes (Addendum)
At [phone] patient received Tylenol PRN due to a 10/10 headache, patient reported he has been having headaches since he has been unable to sleep. He did report that he slept better last night after taking the Seroquel. At 1006 patient came back to the window and notified writer he still had a headache of 6/10. Shuvon Rankin NP notified. Patient came back to the window at 1145 still c/o headache 7/10 and writer notified provider again.

## 2022-10-19 NOTE — ED Notes (Signed)
Patient was provided breakfast

## 2022-10-19 NOTE — ED Notes (Signed)
Pt A/Ox4, VSS. Pt given bus pass to get back to [facility] to receive his belonging since he came here by EMS and his belongings were not seen during this nurses shift.
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
## 2022-10-19 NOTE — ED Provider Notes (Signed)
  Physical Exam  BP 115/82 (BP Location: Right Arm)   Pulse 85   Temp 98 F (36.7 C) (Oral)   Resp 18   Ht 5\' 9"  (1.753 m)   Wt 70.3 kg   SpO2 99%   BMI 22.89 kg/m   Physical Exam  Procedures  Procedures  ED Course / MDM    Medical Decision Making Amount and/or Complexity of Data Reviewed Labs: ordered. Radiology: ordered.  Risk Prescription drug management.   Received in signout.  First-time seizure.  Pending head CT. Patient becoming more anxious.  States he cannot stay in the ER.  Think it is reasonable for discharge at this time.  Does have some underlying Asperger's.  Follow-up with neurology.  Head CT been done but not officially read.  Overall reassuring by my read but pending neurology read.  Will discharge.       Benjiman Core, MD 10/19/22 [phone]

## 2022-10-19 NOTE — ED Notes (Signed)
Pt resting in bed at the current c eyes closed. Resident denies HI, SI, & AVH. Denies pain or any discomfort at this time. No s/s of acute distress observed. VSS. Safety maintained. Will continue to monitor and report any COC.

## 2022-10-19 NOTE — ED Notes (Signed)
Pt c/o of headache. APAP given @ 0747am. Pt still experiencing headache. Provider notified and additional medication requested. Will continue to monitor and report any COC.

## 2022-10-20 LAB — PROLACTIN: Prolactin: 7.8 ng/mL (ref 3.9–22.7)

## 2022-11-25 ENCOUNTER — Ambulatory Visit (HOSPITAL_COMMUNITY): Payer: No Payment, Other | Admitting: Student

## 2022-12-28 ENCOUNTER — Ambulatory Visit (HOSPITAL_COMMUNITY): Payer: No Payment, Other | Admitting: Licensed Clinical Social Worker
